# Patient Record
Sex: Female | Born: 1975 | Race: White | Hispanic: No | Marital: Single | Smoking: Never smoker
Health system: Southern US, Community
[De-identification: ages and names within clinical notes are randomized; demographics above are authoritative.]

## PROBLEM LIST (undated history)

## (undated) DIAGNOSIS — R569 Unspecified convulsions: Secondary | ICD-10-CM

## (undated) DIAGNOSIS — T7840XA Allergy, unspecified, initial encounter: Secondary | ICD-10-CM

## (undated) DIAGNOSIS — I10 Essential (primary) hypertension: Secondary | ICD-10-CM

## (undated) DIAGNOSIS — J45909 Unspecified asthma, uncomplicated: Secondary | ICD-10-CM

## (undated) DIAGNOSIS — Z973 Presence of spectacles and contact lenses: Secondary | ICD-10-CM

## (undated) HISTORY — DX: Allergy, unspecified, initial encounter: T78.40XA

## (undated) HISTORY — PX: WISDOM TOOTH EXTRACTION: SHX21

## (undated) HISTORY — DX: Essential (primary) hypertension: I10

## (undated) HISTORY — DX: Unspecified convulsions: R56.9

---

## 2000-12-12 ENCOUNTER — Other Ambulatory Visit: Admission: RE | Admit: 2000-12-12 | Discharge: 2000-12-12 | Payer: Self-pay | Admitting: Family Medicine

## 2001-12-13 ENCOUNTER — Other Ambulatory Visit: Admission: RE | Admit: 2001-12-13 | Discharge: 2001-12-13 | Payer: Self-pay | Admitting: Family Medicine

## 2002-12-26 ENCOUNTER — Other Ambulatory Visit: Admission: RE | Admit: 2002-12-26 | Discharge: 2002-12-26 | Payer: Self-pay | Admitting: Family Medicine

## 2003-12-28 ENCOUNTER — Other Ambulatory Visit: Admission: RE | Admit: 2003-12-28 | Discharge: 2003-12-28 | Payer: Self-pay | Admitting: Family Medicine

## 2003-12-29 ENCOUNTER — Ambulatory Visit: Payer: Self-pay | Admitting: Family Medicine

## 2004-05-09 ENCOUNTER — Ambulatory Visit: Payer: Self-pay | Admitting: Family Medicine

## 2004-08-09 ENCOUNTER — Ambulatory Visit: Payer: Self-pay | Admitting: Family Medicine

## 2004-11-04 ENCOUNTER — Ambulatory Visit: Payer: Self-pay | Admitting: Family Medicine

## 2005-03-10 ENCOUNTER — Other Ambulatory Visit: Admission: RE | Admit: 2005-03-10 | Discharge: 2005-03-10 | Payer: Self-pay | Admitting: Family Medicine

## 2005-03-10 ENCOUNTER — Ambulatory Visit: Payer: Self-pay | Admitting: Family Medicine

## 2005-07-31 ENCOUNTER — Ambulatory Visit: Payer: Self-pay | Admitting: Family Medicine

## 2005-11-16 ENCOUNTER — Ambulatory Visit: Payer: Self-pay | Admitting: Family Medicine

## 2006-03-19 ENCOUNTER — Other Ambulatory Visit: Admission: RE | Admit: 2006-03-19 | Discharge: 2006-03-19 | Payer: Self-pay | Admitting: Internal Medicine

## 2006-03-19 ENCOUNTER — Encounter: Payer: Self-pay | Admitting: Family Medicine

## 2006-03-19 ENCOUNTER — Encounter (INDEPENDENT_AMBULATORY_CARE_PROVIDER_SITE_OTHER): Payer: Self-pay | Admitting: Internal Medicine

## 2006-03-19 ENCOUNTER — Ambulatory Visit: Payer: Self-pay | Admitting: Family Medicine

## 2007-03-04 ENCOUNTER — Ambulatory Visit: Payer: Self-pay | Admitting: Family Medicine

## 2007-03-04 DIAGNOSIS — J069 Acute upper respiratory infection, unspecified: Secondary | ICD-10-CM | POA: Insufficient documentation

## 2007-03-20 ENCOUNTER — Encounter: Payer: Self-pay | Admitting: Family Medicine

## 2007-03-20 DIAGNOSIS — J45909 Unspecified asthma, uncomplicated: Secondary | ICD-10-CM | POA: Insufficient documentation

## 2007-03-20 DIAGNOSIS — I10 Essential (primary) hypertension: Secondary | ICD-10-CM | POA: Insufficient documentation

## 2007-03-20 DIAGNOSIS — J301 Allergic rhinitis due to pollen: Secondary | ICD-10-CM

## 2007-03-25 ENCOUNTER — Other Ambulatory Visit: Admission: RE | Admit: 2007-03-25 | Discharge: 2007-03-25 | Payer: Self-pay | Admitting: Family Medicine

## 2007-03-25 ENCOUNTER — Encounter (INDEPENDENT_AMBULATORY_CARE_PROVIDER_SITE_OTHER): Payer: Self-pay | Admitting: Internal Medicine

## 2007-03-25 ENCOUNTER — Ambulatory Visit: Payer: Self-pay | Admitting: Family Medicine

## 2007-03-25 DIAGNOSIS — G40909 Epilepsy, unspecified, not intractable, without status epilepticus: Secondary | ICD-10-CM

## 2007-03-26 LAB — CONVERTED CEMR LAB
CO2: 26 meq/L (ref 19–32)
GFR calc Af Amer: 126 mL/min
Glucose, Bld: 95 mg/dL (ref 70–99)
Potassium: 4.2 meq/L (ref 3.5–5.1)

## 2007-03-27 ENCOUNTER — Encounter (INDEPENDENT_AMBULATORY_CARE_PROVIDER_SITE_OTHER): Payer: Self-pay | Admitting: Internal Medicine

## 2007-03-28 ENCOUNTER — Encounter (INDEPENDENT_AMBULATORY_CARE_PROVIDER_SITE_OTHER): Payer: Self-pay | Admitting: Internal Medicine

## 2007-03-29 ENCOUNTER — Encounter (INDEPENDENT_AMBULATORY_CARE_PROVIDER_SITE_OTHER): Payer: Self-pay | Admitting: *Deleted

## 2007-06-04 ENCOUNTER — Ambulatory Visit: Payer: Self-pay | Admitting: Family Medicine

## 2007-06-04 DIAGNOSIS — J309 Allergic rhinitis, unspecified: Secondary | ICD-10-CM | POA: Insufficient documentation

## 2007-09-09 ENCOUNTER — Ambulatory Visit: Payer: Self-pay | Admitting: Family Medicine

## 2008-01-22 ENCOUNTER — Encounter (INDEPENDENT_AMBULATORY_CARE_PROVIDER_SITE_OTHER): Payer: Self-pay | Admitting: *Deleted

## 2008-05-14 ENCOUNTER — Other Ambulatory Visit: Admission: RE | Admit: 2008-05-14 | Discharge: 2008-05-14 | Payer: Self-pay | Admitting: Family Medicine

## 2008-05-14 ENCOUNTER — Ambulatory Visit: Payer: Self-pay | Admitting: Family Medicine

## 2008-05-14 ENCOUNTER — Encounter (INDEPENDENT_AMBULATORY_CARE_PROVIDER_SITE_OTHER): Payer: Self-pay | Admitting: Internal Medicine

## 2008-05-14 LAB — CONVERTED CEMR LAB: Pap Smear: NORMAL

## 2008-05-15 LAB — CONVERTED CEMR LAB
BUN: 13 mg/dL (ref 6–23)
Basophils Absolute: 0 10*3/uL (ref 0.0–0.1)
Calcium: 9.1 mg/dL (ref 8.4–10.5)
GFR calc non Af Amer: 103.21 mL/min (ref >60)
Hemoglobin: 14.3 g/dL (ref 12.0–15.0)
LDL Cholesterol: 97 mg/dL (ref 0–99)
Lymphocytes Relative: 18.1 % (ref 12.0–46.0)
Monocytes Relative: 3.4 % (ref 3.0–12.0)
Neutro Abs: 10.6 10*3/uL — ABNORMAL HIGH (ref 1.4–7.7)
Neutrophils Relative %: 75.8 % (ref 43.0–77.0)
Potassium: 4.5 meq/L (ref 3.5–5.1)
RDW: 11.9 % (ref 11.5–14.6)
Sodium: 141 meq/L (ref 135–145)
TSH: 0.81 microintl units/mL (ref 0.35–5.50)
VLDL: 27 mg/dL (ref 0.0–40.0)

## 2008-05-20 ENCOUNTER — Encounter (INDEPENDENT_AMBULATORY_CARE_PROVIDER_SITE_OTHER): Payer: Self-pay | Admitting: *Deleted

## 2008-05-21 ENCOUNTER — Ambulatory Visit: Payer: Self-pay | Admitting: Family Medicine

## 2008-11-04 ENCOUNTER — Ambulatory Visit: Payer: Self-pay | Admitting: Family Medicine

## 2008-11-18 ENCOUNTER — Ambulatory Visit: Payer: Self-pay | Admitting: Family Medicine

## 2009-04-06 ENCOUNTER — Ambulatory Visit: Payer: Self-pay | Admitting: Family Medicine

## 2009-06-03 ENCOUNTER — Ambulatory Visit: Payer: Self-pay | Admitting: Family Medicine

## 2009-06-03 ENCOUNTER — Other Ambulatory Visit: Admission: RE | Admit: 2009-06-03 | Discharge: 2009-06-03 | Payer: Self-pay | Admitting: Family Medicine

## 2009-06-04 LAB — CONVERTED CEMR LAB
ALT: 13 units/L (ref 0–35)
Albumin: 3.7 g/dL (ref 3.5–5.2)
BUN: 9 mg/dL (ref 6–23)
CO2: 24 meq/L (ref 19–32)
Calcium: 8.8 mg/dL (ref 8.4–10.5)
Chloride: 110 meq/L (ref 96–112)
Cholesterol: 166 mg/dL (ref 0–200)
Creatinine, Ser: 0.7 mg/dL (ref 0.4–1.2)
Glucose, Bld: 87 mg/dL (ref 70–99)
HDL: 33.2 mg/dL — ABNORMAL LOW (ref >39.00)
Total CHOL/HDL Ratio: 5
Total Protein: 6.7 g/dL (ref 6.0–8.3)
Triglycerides: 139 mg/dL (ref 0.0–149.0)

## 2009-06-08 ENCOUNTER — Encounter (INDEPENDENT_AMBULATORY_CARE_PROVIDER_SITE_OTHER): Payer: Self-pay | Admitting: *Deleted

## 2009-06-08 LAB — CONVERTED CEMR LAB: Pap Smear: NEGATIVE

## 2009-08-20 ENCOUNTER — Ambulatory Visit: Payer: Self-pay | Admitting: Family Medicine

## 2009-08-20 DIAGNOSIS — J019 Acute sinusitis, unspecified: Secondary | ICD-10-CM

## 2009-09-16 ENCOUNTER — Ambulatory Visit: Payer: Self-pay | Admitting: Family Medicine

## 2009-09-16 DIAGNOSIS — S61209A Unspecified open wound of unspecified finger without damage to nail, initial encounter: Secondary | ICD-10-CM | POA: Insufficient documentation

## 2010-01-17 ENCOUNTER — Encounter: Payer: Self-pay | Admitting: Family Medicine

## 2010-03-29 NOTE — Assessment & Plan Note (Signed)
Summary: ?SINUS INFECTION/CLE   Vital Signs:  Patient profile:   35 year old female Weight:      181 pounds Temp:     97.5 degrees F oral Pulse rate:   80 / minute Pulse rhythm:   regular BP sitting:   132 / 88  (right arm) Cuff size:   regular  Vitals Entered By: Lowella Petties CMA (August 20, 2009 11:42 AM) CC: Sinus headache and pressure   Acute Visit History:      The patient complains of earache, headache, musculoskeletal symptoms, nasal discharge, sinus problems, and sore throat.  These symptoms began 1 week ago.  She denies cough, eye symptoms, fever, genitourinary symptoms, and nausea.        There have been 'cold' or URI symptoms associated with the earache.  There is no history of recent antibiotic usage or recurrent otitis media associated with the earache.        'Cold' or URI symptoms have been present with the sore throat.  There is no history of dysphagia, drooling, or recent exposure to strep.        She complains of sinus pressure, teeth aching, ears being blocked, nasal congestion, purulent drainage, and frontal headache.  The patient has had a past history of sinusitis.        Urine output has been normal.  She is tolerating clear liquids.        Allergies: No Known Drug Allergies  Past History:  Past medical, surgical, family and social histories (including risk factors) reviewed, and no changes noted (except as noted below). Social history (including risk factors) reviewed for relevance to current acute and chronic problems.  Past Medical History: Reviewed history from 03/20/2007 and no changes required. Hypertension  Past Surgical History: Reviewed history from 03/25/2007 and no changes required. wisdom teeth--age 62  Family History: Reviewed history from 03/25/2007 and no changes required. Father: 60's--DM, HBP, elevated lipids Mother: 54--pre DM, obese,  Siblings: 1 br --L&W   DM- type 1--MGM, Mcousin MI-  CVA- 0 Prostate Cancer-0 Breast  Cancer-MGGM Ovarian Cancer--0 Uterine Cancer--0 Colon Cancer-0 Drug/ ETOH Abuse-0 Depression- 0  heart disease--m side Father:  Mother:  Siblings:   Social History: Reviewed history from 05/14/2008 and no changes required. Marital Status:single Children: none Occupation: [employer]  Review of Systems       REVIEW OF SYSTEMS GEN: Acute illness details above. CV: No chest pain or SOB GI: No noted N or V Otherwise, pertinent positives and negatives are noted in the HPI.   Physical Exam  Additional Exam:  Gen: WDWN, NAD; alert,appropriate and cooperative throughout exam  HEENT: Normocephalic and atraumatic. Throat clear, w/o exudate, no LAD, R TM clear, L TM - good landmarks, No fluid present. rhinnorhea.  Left frontal and maxillary sinuses: Tender, max Right frontal and maxillary sinuses: Tender, max  Neck: No ant or post LAD  CV: RRR, No M/G/R  Pulm: Breathing comfortably in no resp distress. no w/c/r  Abd: S,NT,ND,+BS  Extr: no c/c/e  Psych: full affect, pleasant    Impression & Recommendations:  Problem # 1:  SINUSITIS - ACUTE-NOS (ICD-461.9) Assessment New Please see the patient instructions for a detailed list of plans and what was discussed with the patient.   7 days, if signs cont or worsen in next couple days, start ABX  Her updated medication list for this problem includes:    Astepro 0.15 % Soln (Azelastine hcl) .Marland Kitchen... 2 sprays each nostril two times a day  for allergies    Nasonex 50 Mcg/act Susp (Mometasone furoate) .Marland Kitchen... 2 sprays each nostril daily as needed    Amoxicillin 500 Mg Tabs (Amoxicillin) .Marland KitchenMarland KitchenMarland KitchenMarland Kitchen 3 tabs by mouth two times a day for 10 days  Complete Medication List: 1)  Ortho-novum 7/7/7 (28) 0.5/0.75/1-35 Mg-mcg Tabs (Norethin-eth estrad triphasic) .... Take 1 tablet by mouth once a day 2)  Altace 5 Mg Caps (Ramipril) .... Take 1 capsule by mouth once a day 3)  Zyrtec Allergy 10 Mg Tabs (Cetirizine hcl) .... Take 1 tablet by  mouth once a day 4)  Astepro 0.15 % Soln (Azelastine hcl) .... 2 sprays each nostril two times a day for allergies 5)  Proair Hfa 108 (90 Base) Mcg/act Aers (Albuterol sulfate) .... 2 sprays every 4 hrs as needed wheezing 6)  Nasonex 50 Mcg/act Susp (Mometasone furoate) .... 2 sprays each nostril daily as needed 7)  Amoxicillin 500 Mg Tabs (Amoxicillin) .... 3 tabs by mouth two times a day for 10 days  Patient Instructions: 1)  SINUSITIS 2)  Sinuses are cavities in facial skeleton that drain to nose. Impaired drainage and obstruction of sinus passages main cause. 3)  Treatment: 4)  1. Take all Antibiotics 5)  2. Open nasal and sinus canals: Oral decongestant: Sudafed. (CAUTION IF HIGH BLOOD PRESSURE) 6)  3. Steam inhalation 7)  4. Humidifier in room 8)  5. Frequent nasal saline irrigation 9)  6. Moist heat compresses to face 10)  7. Tylenol or Ibuprofen for pain and fever, follow directions on bottle.  Prescriptions: AMOXICILLIN 500 MG TABS (AMOXICILLIN) 3 tabs by mouth two times a day for 10 days  #60 x 0   Entered and Authorized by:   Hannah Beat MD   Signed by:   Hannah Beat MD on 08/20/2009   Method used:   Print then Give to Patient   RxID:   [id number]   Prior Medications (reviewed today): ORTHO-NOVUM 7/7/7 (28) 0.5/0.75/1-35 MG-MCG  TABS (NORETHIN-ETH ESTRAD TRIPHASIC) Take 1 tablet by mouth once a day ALTACE 5 MG  CAPS (RAMIPRIL) Take 1 capsule by mouth once a day ZYRTEC ALLERGY 10 MG  TABS (CETIRIZINE HCL) Take 1 tablet by mouth once a day ASTEPRO 0.15 % SOLN (AZELASTINE HCL) 2 sprays each nostril two times a day for allergies PROAIR HFA 108 (90 BASE) MCG/ACT AERS (ALBUTEROL SULFATE) 2 sprays every 4 hrs as needed wheezing NASONEX 50 MCG/ACT SUSP (MOMETASONE FUROATE) 2 sprays each nostril daily as needed Current Allergies: No known allergies

## 2010-03-29 NOTE — Assessment & Plan Note (Signed)
Summary: SORE THROAT, EAR HURTING   Vital Signs:  Patient profile:   35 year old female Height:      63.5 inches Weight:      174.25 pounds BMI:     30.49 Temp:     98.1 degrees F oral Pulse rate:   84 / minute Pulse rhythm:   regular BP sitting:   132 / 92  (left arm) Cuff size:   regular  Vitals Entered By: Delilah Shan CMA Duncan Dull) (April 06, 2009 10:57 AM) CC: ST, ear hurting, head congestion   History of Present Illness: 35 yo with h/o asthma with 2 weeks of worsening URI symptoms. Started with runny nose, sinus pressure. Now having dry cough and wheezing. No shortness of breath. Subjective fevers and chills. Throat hurts when coughing. 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  Current Medications (verified): 1)  Ortho-Novum 7/7/7 (28) 0.5/0.75/1-35 Mg-Mcg  Tabs (Norethin-Eth Estrad Triphasic) .... Take 1 Tablet By Mouth Once A Day 2)  Altace 5 Mg  Caps (Ramipril) .... Take 1 Capsule By Mouth Once A Day 3)  Zyrtec Allergy 10 Mg  Tabs (Cetirizine Hcl) .... Take 1 Tablet By Mouth Once A Day 4)  Astepro 0.15 % Soln (Azelastine Hcl) .... 2 Sprays Each Nostril Two Times A Day For Allergies 5)  Proair Hfa 108 (90 Base) Mcg/act Aers (Albuterol Sulfate) .... 2 Sprays Every 4 Hrs As Needed Wheezing 6)  Nasonex 50 Mcg/act Susp (Mometasone Furoate) .... 2 Sprays Each Nostril Daily As Needed 7)  Augmentin 500-125 Mg Tabs (Amoxicillin-Pot Clavulanate) .Marland Kitchen.. 1 By Mouth 2 Times Daily X 10 Days  Allergies (verified): No Known Drug Allergies  Review of Systems      See HPI General:  Complains of chills and fever. ENT:  Complains of earache, postnasal drainage, sinus pressure, and sore throat; denies ear discharge. Resp:  Complains of cough; denies shortness of breath, sputum productive, and wheezing.  Physical Exam  General:  alert, well-developed, well-nourished, and well-hydrated.   VS reviewed - afebrile, normotensive Ears:  TMs retracted with fluid  bilat Nose:  boggy and edematus, sinuses neg, no airflow obstruction.   Mouth:  no exudates and pharyngeal erythema.   Lungs:  normal respiratory effort, no intercostal retractions, no accessory muscle use,, scattered exp wheezes, no crackles. Heart:  normal rate, regular rhythm, and no murmur.   Psych:  normally interactive and good eye contact.     Impression & Recommendations:  Problem # 1:  URI (ICD-465.9) Assessment New with probable sinusitis/bronchitis. Given duration and progression of symptoms, will treat with Augmentin. Continue as needed albuterol and other supportive measures. See pt instructions for details. Her updated medication list for this problem includes:    Zyrtec Allergy 10 Mg Tabs (Cetirizine hcl) .Marland Kitchen... Take 1 tablet by mouth once a day  Complete Medication List: 1)  Ortho-novum 7/7/7 (28) 0.5/0.75/1-35 Mg-mcg Tabs (Norethin-eth estrad triphasic) .... Take 1 tablet by mouth once a day 2)  Altace 5 Mg Caps (Ramipril) .... Take 1 capsule by mouth once a day 3)  Zyrtec Allergy 10 Mg Tabs (Cetirizine hcl) .... Take 1 tablet by mouth once a day 4)  Astepro 0.15 % Soln (Azelastine hcl) .... 2 sprays each nostril two times a day for allergies 5)  Proair Hfa 108 (90 Base) Mcg/act Aers (Albuterol sulfate) .... 2 sprays every 4 hrs as needed wheezing 6)  Nasonex 50 Mcg/act Susp (Mometasone furoate) .... 2 sprays each nostril daily as needed 7)  Augmentin 500-125 Mg Tabs (Amoxicillin-pot clavulanate) .Marland Kitchen.. 1 by mouth 2 times daily x 10 days  Patient Instructions: 1)  Take antibiotic as directed.  Drink lots of fluids.  Treat sympotmatically with Mucinex, nasal saline irrigation, and Tylenol/Ibuprofen. Use albuterol inhaler as needed. Call if not improving as expected in 5-7 days.  Prescriptions: AUGMENTIN 500-125 MG TABS (AMOXICILLIN-POT CLAVULANATE) 1 by mouth 2 times daily x 10 days  #20 x 0   Entered and Authorized by:   Ruthe Mannan MD   Signed by:   Ruthe Mannan MD on  04/06/2009   Method used:   Electronically to        [facility]* (retail)       [address]       [place], [place]  [address]       Ph: [phone]       Fax: [phone]   RxID:   [id number] AUGMENTIN 500-125 MG TABS (AMOXICILLIN-POT CLAVULANATE) 1 by mouth 2 times daily x 10 days  #20 x 0   Entered and Authorized by:   Ruthe Mannan MD   Signed by:   Ruthe Mannan MD on 04/06/2009   Method used:   Electronically to        [facility]. [phone]* (retail)       [address]       [place], [place]  [address]       Ph: [phone]       Fax: [phone]   RxID:   [id number]   Current Allergies (reviewed today): No known allergies

## 2010-03-29 NOTE — Assessment & Plan Note (Signed)
Summary: CUT FINGER   Vital Signs:  Patient profile:   35 year old female Height:      63.5 inches Weight:      184.4 pounds BMI:     32.27 Temp:     98.9 degrees F oral Pulse rate:   80 / minute Pulse rhythm:   regular BP sitting:   132 / 82  (left arm) Cuff size:   regular  Vitals Entered By: Benny Lennert CMA Duncan Dull) (September 16, 2009 2:48 PM)  History of Present Illness: 35 yo cut 5th finger on left hand this morning on a metal cabinet.  Has been bleeding since she cut it.  Does stop when she applies pressure but keeps trickling blood. Not dizzy or lightheaded. No known problems with clotting.  Tetanus immunization UTD.  Current Medications (verified): 1)  Ortho-Novum 7/7/7 (28) 0.5/0.75/1-35 Mg-Mcg  Tabs (Norethin-Eth Estrad Triphasic) .... Take 1 Tablet By Mouth Once A Day 2)  Altace 5 Mg  Caps (Ramipril) .... Take 1 Capsule By Mouth Once A Day 3)  Zyrtec Allergy 10 Mg  Tabs (Cetirizine Hcl) .... Take 1 Tablet By Mouth Once A Day 4)  Astepro 0.15 % Soln (Azelastine Hcl) .... 2 Sprays Each Nostril Two Times A Day For Allergies 5)  Proair Hfa 108 (90 Base) Mcg/act Aers (Albuterol Sulfate) .... 2 Sprays Every 4 Hrs As Needed Wheezing 6)  Nasonex 50 Mcg/act Susp (Mometasone Furoate) .... 2 Sprays Each Nostril Daily As Needed  Allergies (verified): No Known Drug Allergies  Review of Systems      See HPI General:  Denies chills and fever. Neuro:  Denies weakness.  Physical Exam  General:  alert, well-developed, well-nourished, and well-hydrated.    Skin:  1cm cut on finger, bleeding Psych:  Cognition and judgment appear intact. Alert and cooperative with normal attention span and concentration. No apparent delusions, illusions, hallucinations   Impression & Recommendations:  Problem # 1:  LACERATION OF FINGER (ICD-883.0) Assessment New top layer of skin removed, unable to stitch at this time and likely not necessary. Finger cleaned with water and alcohol.   Cauterized with Silver nitrite. Bleeding has stopped.  Hemodynamically stable.  Complete Medication List: 1)  Ortho-novum 7/7/7 (28) 0.5/0.75/1-35 Mg-mcg Tabs (Norethin-eth estrad triphasic) .... Take 1 tablet by mouth once a day 2)  Altace 5 Mg Caps (Ramipril) .... Take 1 capsule by mouth once a day 3)  Zyrtec Allergy 10 Mg Tabs (Cetirizine hcl) .... Take 1 tablet by mouth once a day 4)  Astepro 0.15 % Soln (Azelastine hcl) .... 2 sprays each nostril two times a day for allergies 5)  Proair Hfa 108 (90 Base) Mcg/act Aers (Albuterol sulfate) .... 2 sprays every 4 hrs as needed wheezing 6)  Nasonex 50 Mcg/act Susp (Mometasone furoate) .... 2 sprays each nostril daily as needed   Current Allergies (reviewed today): No known allergies

## 2010-03-29 NOTE — Assessment & Plan Note (Signed)
Summary: PAP SMEAR AND CPX/BILLIE'S PT/CLE   Vital Signs:  Patient profile:   35 year old female LMP:     05/20/2009 Height:      63.5 inches Weight:      174.13 pounds BMI:     30.47 Temp:     97.7 degrees F oral Pulse rate:   84 / minute Pulse rhythm:   regular BP sitting:   142 / 88  (left arm) Cuff size:   regular  Vitals Entered By: Delilah Shan CMA Duncan Dull) (June 03, 2009 8:39 AM)  Serial Vital Signs/Assessments:  Time      Position  BP       Pulse  Resp  Temp     By                     161/09                         Ruthe Mannan MD  CC: Pap / CPX LMP (date): 05/20/2009     Enter LMP: 05/20/2009 Last PAP Result normal   History of Present Illness: 35 yo with h/o asthma here for CPX.  Asthma- uses her proair once or twice a week.  Always a little worse this time of year with seasonal allergies. Also uses Astepro and nasonex as needed for Allergic rhinitis.  HTN- Has been on Altace 5 mg daily for over two years. BP elevated today but nervous about pap.  Also has not taken her medication yet today. No side effects.  Well woman- GO, no h/o abnormal pap smears. No family h/o cevical, breast or uterine cancer. Has been on ortho novum for years, no issues with it. Has not had lipid panel checked since last year.  Current Medications (verified): 1)  Ortho-Novum 7/7/7 (28) 0.5/0.75/1-35 Mg-Mcg  Tabs (Norethin-Eth Estrad Triphasic) .... Take 1 Tablet By Mouth Once A Day 2)  Altace 5 Mg  Caps (Ramipril) .... Take 1 Capsule By Mouth Once A Day 3)  Zyrtec Allergy 10 Mg  Tabs (Cetirizine Hcl) .... Take 1 Tablet By Mouth Once A Day 4)  Astepro 0.15 % Soln (Azelastine Hcl) .... 2 Sprays Each Nostril Two Times A Day For Allergies 5)  Proair Hfa 108 (90 Base) Mcg/act Aers (Albuterol Sulfate) .... 2 Sprays Every 4 Hrs As Needed Wheezing 6)  Nasonex 50 Mcg/act Susp (Mometasone Furoate) .... 2 Sprays Each Nostril Daily As Needed  Allergies (verified): No Known Drug  Allergies  Past History:  Past Medical History: Last updated: 03/20/2007 Hypertension  Past Surgical History: Last updated: 03/25/2007 wisdom teeth--age 69  Family History: Last updated: 03/25/2007 Father: 60's--DM, HBP, elevated lipids Mother: 54--pre DM, obese,  Siblings: 1 br --L&W   DM- type 1--MGM, Mcousin MI-  CVA- 0 Prostate Cancer-0 Breast Cancer-MGGM Ovarian Cancer--0 Uterine Cancer--0 Colon Cancer-0 Drug/ ETOH Abuse-0 Depression- 0  heart disease--m side Father:  Mother:  Siblings:   Social History: Last updated: 05/14/2008 Marital Status:single Children: none Occupation: [employer]  Risk Factors: Alcohol Use: <1 (05/14/2008) Caffeine Use: 3 (05/14/2008) Exercise: yes (05/14/2008)  Risk Factors: Smoking Status: never (05/14/2008) Passive Smoke Exposure: yes (03/25/2007)  Review of Systems General:  Denies fatigue, fever, weakness, and weight loss. Eyes:  Denies blurring. ENT:  Denies ear discharge. CV:  Denies chest pain or discomfort and shortness of breath with exertion. Resp:  Denies cough, shortness of breath, sputum productive, and wheezing. GI:  Denies abdominal pain  and bloody stools. GU:  Denies discharge and dysuria. MS:  Denies joint pain, joint redness, and joint swelling. Derm:  Denies rash. Neuro:  Denies headaches. Psych:  Denies anxiety and depression. Endo:  Denies cold intolerance and heat intolerance.  Physical Exam  General:  alert, well-developed, well-nourished, and well-hydrated.    Eyes:  vision grossly intact, pupils equal, and pupils round.   Ears:  R ear normal and L ear normal.   Mouth:  good dentition.   Neck:  No deformities, masses, or tenderness noted. Breasts:  No mass, nodules, thickening, tenderness, bulging, retraction, inflamation, nipple discharge or skin changes noted.   Lungs:  Normal respiratory effort, chest expands symmetrically. Lungs are clear to auscultation, no crackles or  wheezes. Heart:  Normal rate and regular rhythm. S1 and S2 normal without gallop, murmur, click, rub or other extra sounds. Abdomen:  Bowel sounds positive,abdomen soft and non-tender without masses, organomegaly or hernias noted. Genitalia:  Pelvic Exam:        External: normal female genitalia without lesions or masses        Vagina: normal without lesions or masses        Cervix: normal without lesions or masses        Adnexa: normal bimanual exam without masses or fullness        Uterus: normal by palpation        Pap smear: performed Extremities:  No clubbing, cyanosis, edema, or deformity noted with normal full range of motion of all joints.   Psych:  Cognition and judgment appear intact. Alert and cooperative with normal attention span and concentration. No apparent delusions, illusions, hallucinations   Impression & Recommendations:  Problem # 1:  WELL ADULT (ICD-V70.0) Reviewed preventive care protocols, scheduled due services, and updated immunizations Discussed nutrition, exercise, diet, and healthy lifestyle.  Pap, lipid panel, BMET, hepatic panel today.  Problem # 2:  HYPERTENSION (ICD-401.9) Assessment: Unchanged Stable, continue Altace. Her updated medication list for this problem includes:    Altace 5 Mg Caps (Ramipril) .Marland Kitchen... Take 1 capsule by mouth once a day  Orders: Venipuncture (46962) TLB-BMP (Basic Metabolic Panel-BMET) (80048-METABOL)  Problem # 3:  ASTHMA, MILD (ICD-493.90) Assessment: Unchanged Appears well controlled with as needed rescue inhaler. Her updated medication list for this problem includes:    Proair Hfa 108 (90 Base) Mcg/act Aers (Albuterol sulfate) .Marland Kitchen... 2 sprays every 4 hrs as needed wheezing  Complete Medication List: 1)  Ortho-novum 7/7/7 (28) 0.5/0.75/1-35 Mg-mcg Tabs (Norethin-eth estrad triphasic) .... Take 1 tablet by mouth once a day 2)  Altace 5 Mg Caps (Ramipril) .... Take 1 capsule by mouth once a day 3)  Zyrtec Allergy 10 Mg  Tabs (Cetirizine hcl) .... Take 1 tablet by mouth once a day 4)  Astepro 0.15 % Soln (Azelastine hcl) .... 2 sprays each nostril two times a day for allergies 5)  Proair Hfa 108 (90 Base) Mcg/act Aers (Albuterol sulfate) .... 2 sprays every 4 hrs as needed wheezing 6)  Nasonex 50 Mcg/act Susp (Mometasone furoate) .... 2 sprays each nostril daily as needed  Other Orders: TLB-Lipid Panel (80061-LIPID) TLB-Hepatic/Liver Function Pnl (80076-HEPATIC) Pap Smear, Thin Prep ( Collection of) (X5284)  Patient Instructions: 1)  Great to see you, Jamelyn. 2)  We will let you know about your lab work tomorrow or Monday. 3)  Have a great weekend. Prescriptions: NASONEX 50 MCG/ACT SUSP (MOMETASONE FUROATE) 2 sprays each nostril daily as needed  #1 x 1   Entered and Authorized  by:   Ruthe Mannan MD   Signed by:   Ruthe Mannan MD on 06/03/2009   Method used:   Electronically to        [facility]. [phone]* (retail)       [address]       [place], [place]  [address]       Ph: [phone]       Fax: [phone]   RxID:   [id number] PROAIR HFA 108 (90 BASE) MCG/ACT AERS (ALBUTEROL SULFATE) 2 sprays every 4 hrs as needed wheezing  #1 x 1   Entered and Authorized by:   Ruthe Mannan MD   Signed by:   Ruthe Mannan MD on 06/03/2009   Method used:   Electronically to        [facility]. [phone]* (retail)       [address]       [place], [place]  [address]       Ph: [phone]       Fax: [phone]   RxID:   [id number] ASTEPRO 0.15 % SOLN (AZELASTINE HCL) 2 sprays each nostril two times a day for allergies Brand medically necessary #1 x 12   Entered and Authorized by:   Ruthe Mannan MD   Signed by:   Ruthe Mannan MD on 06/03/2009   Method used:   Electronically to        [facility]. [phone]* (retail)       [address]       [place], [place]  [address]       Ph: [phone]        Fax: [phone]   RxID:   [id number] ORTHO-NOVUM 7/7/7 (28) 0.5/0.75/1-35 MG-MCG  TABS (NORETHIN-ETH ESTRAD TRIPHASIC) Take 1 tablet by mouth once a day  #28 Tablet x 12   Entered and Authorized by:   Ruthe Mannan MD   Signed by:   Ruthe Mannan MD on 06/03/2009   Method used:   Electronically to        [facility]. [phone]* (retail)       [address]       [place], [place]  [address]       Ph: [phone]       Fax: [phone]   RxID:   [id number] ALTACE 5 MG  CAPS (RAMIPRIL) Take 1 capsule by mouth once a day  #30 Capsule x 6   Entered and Authorized by:   Ruthe Mannan MD   Signed by:   Ruthe Mannan MD on 06/03/2009   Method used:   Electronically to        [facility]. [phone]* (retail)       [address]       [place], [place]  [address]       Ph: [phone]       Fax: [phone]   RxID:   [id number]   Current Allergies (reviewed today): No known allergies

## 2010-03-29 NOTE — Miscellaneous (Signed)
Summary: Flu vaccine   Clinical Lists Changes  Observations: Added new observation of FLU VAX: Historical (01/11/2010 15:52)      Influenza Immunization History:    Influenza # 1:  Historical (01/11/2010) Received form from [facility], [address] [place], [place]

## 2010-03-29 NOTE — Letter (Signed)
Summary: Results Follow up Letter  [place] [facility]  [address] [place], [place] [address]   Phone: [phone]  Fax: [phone]    06/08/2009 MRN: [id number]    [facility] [address] [place], [place]  [address]    Dear Ms. Ehmke,  The following are the results of your recent test(s):  Test         Result    Pap Smear:        Normal ___X__  Not Normal _____ Comments: ______________________________________________________ Cholesterol: LDL(Bad cholesterol):         Your goal is less than:         HDL (Good cholesterol):       Your goal is more than: Comments:  ______________________________________________________ Mammogram:        Normal _____  Not Normal _____ Comments: _____________________________________________________________________ Hemoccult:        Normal _____  Not normal _______ Comments:    _____________________________________________________________________ Other Tests:    We routinely do not discuss normal results over the telephone.  If you desire a copy of the results, or you have any questions about this information we can discuss them at your next office visit.   Sincerely,    Ruthe Mannan,  M.D.  TA:lsf

## 2010-06-06 ENCOUNTER — Other Ambulatory Visit: Payer: Self-pay | Admitting: Family Medicine

## 2010-06-08 ENCOUNTER — Other Ambulatory Visit: Payer: Self-pay | Admitting: *Deleted

## 2010-06-08 MED ORDER — RAMIPRIL 5 MG PO CAPS: 5.0000 mg | ORAL_CAPSULE | Freq: Every day | ORAL | Status: DC

## 2010-06-10 ENCOUNTER — Telehealth: Payer: Self-pay | Admitting: *Deleted

## 2010-06-10 NOTE — Telephone Encounter (Signed)
Pt called complaining of diarrhea x 24 hours, asked to be seen.  No other symptoms- no fever, abd pain or vomiting.   Advised nothing that we can really do for her here.  Advised her to drink plenty of fluids, rest, avoid dairy products, try BRAT diet.  Call back the first of the week if still having problem.  Suggested she not take anything to try and stop it, best to get it out of her system.

## 2010-06-10 NOTE — Telephone Encounter (Signed)
Agreed -

## 2010-06-10 NOTE — Telephone Encounter (Signed)
Patient advised as instructed via telephone. 

## 2010-06-20 ENCOUNTER — Encounter: Payer: Self-pay | Admitting: Family Medicine

## 2010-06-24 ENCOUNTER — Encounter: Payer: Self-pay | Admitting: Family Medicine

## 2010-06-24 LAB — HM PAP SMEAR

## 2010-06-27 ENCOUNTER — Encounter: Payer: Self-pay | Admitting: Family Medicine

## 2010-07-05 ENCOUNTER — Other Ambulatory Visit: Payer: Self-pay | Admitting: Family Medicine

## 2010-07-30 ENCOUNTER — Other Ambulatory Visit: Payer: Self-pay | Admitting: Family Medicine

## 2010-08-02 ENCOUNTER — Other Ambulatory Visit: Payer: Self-pay | Admitting: *Deleted

## 2010-08-02 MED ORDER — NORETHIN-ETH ESTRAD TRIPHASIC 0.5/0.75/1-35 MG-MCG PO TABS: 1.0000 | ORAL_TABLET | Freq: Every day | ORAL | Status: DC

## 2010-08-02 NOTE — Telephone Encounter (Signed)
Pharmacy did not receive rx request

## 2010-08-09 ENCOUNTER — Other Ambulatory Visit (HOSPITAL_COMMUNITY)
Admission: RE | Admit: 2010-08-09 | Discharge: 2010-08-09 | Disposition: A | Discharge status: Home / Self Care | Payer: BC Managed Care – PPO | Source: Ambulatory Visit | Attending: Family Medicine | Admitting: Family Medicine

## 2010-08-09 ENCOUNTER — Encounter: Payer: Self-pay | Admitting: Family Medicine

## 2010-08-09 ENCOUNTER — Ambulatory Visit (INDEPENDENT_AMBULATORY_CARE_PROVIDER_SITE_OTHER): Payer: BC Managed Care – PPO | Admitting: Family Medicine

## 2010-08-09 VITALS — BP 130/90 | HR 79 | Temp 98.0°F | Ht 63.5 in | Wt 177.5 lb

## 2010-08-09 DIAGNOSIS — Z136 Encounter for screening for cardiovascular disorders: Secondary | ICD-10-CM

## 2010-08-09 DIAGNOSIS — Z01419 Encounter for gynecological examination (general) (routine) without abnormal findings: Secondary | ICD-10-CM | POA: Insufficient documentation

## 2010-08-09 DIAGNOSIS — Z1159 Encounter for screening for other viral diseases: Secondary | ICD-10-CM | POA: Insufficient documentation

## 2010-08-09 DIAGNOSIS — I10 Essential (primary) hypertension: Secondary | ICD-10-CM

## 2010-08-09 DIAGNOSIS — Z0000 Encounter for general adult medical examination without abnormal findings: Secondary | ICD-10-CM | POA: Insufficient documentation

## 2010-08-09 LAB — BASIC METABOLIC PANEL
CO2: 25 mEq/L (ref 19–32)
Chloride: 107 mEq/L (ref 96–112)
GFR: 89.85 mL/min (ref >60.00)
Glucose, Bld: 88 mg/dL (ref 70–99)
Potassium: 4 mEq/L (ref 3.5–5.1)
Sodium: 136 mEq/L (ref 135–145)

## 2010-08-09 LAB — LIPID PANEL
HDL: 30.9 mg/dL — ABNORMAL LOW (ref >39.00)
LDL Cholesterol: 101 mg/dL — ABNORMAL HIGH (ref 0–99)
VLDL: 32.8 mg/dL (ref 0.0–40.0)

## 2010-08-09 NOTE — Progress Notes (Signed)
35 yo with h/o asthma here for CPX.  Asthma- uses her proair once or twice a month.  Always a little worse this time of year with seasonal allergies. Also uses Astepro and nasonex as needed for Allergic rhinitis.  HTN- Has been on Altace 5 mg daily for over two years. No side effects.  Dad was just diagnosed with ALS, but not rapidly progressive type so she is doing ok.    Well woman- GO, no h/o abnormal pap smears. No family h/o cevical, breast or uterine cancer. Has been on ortho novum for years, no issues with it. Denies any dysuria, vaginal discharge or other symptoms.  The PMH, PSH, Social History, Family History, Medications, and allergies have been reviewed in [facility], and have been updated if relevant.   Review of Systems  See HPI Patient reports no  vision/ hearing changes,anorexia, weight change, fever ,adenopathy, persistant / recurrent hoarseness, swallowing issues, chest pain, edema,persistant / recurrent cough, hemoptysis, dyspnea(rest, exertional, paroxysmal nocturnal), gastrointestinal  bleeding (melena, rectal bleeding), abdominal pain, excessive heart burn, GU symptoms(dysuria, hematuria, pyuria, voiding/incontinence  Issues) syncope, focal weakness, severe memory loss, concerning skin lesions, depression, anxiety, abnormal bruising/bleeding, major joint swelling, breast masses or abnormal vaginal bleeding.     Physical Exam BP 130/90  Pulse 79  Temp(Src) 98 F (36.7 C) (Oral)  Ht 5' 3.5" (1.613 m)  Wt 177 lb 8 oz (80.513 kg)  BMI 30.95 kg/m2  LMP 07/08/2010  General:  alert, well-developed, well-nourished, and well-hydrated.   Eyes:  vision grossly intact, pupils equal, and pupils round.   Ears:  R ear normal and L ear normal.   Mouth:  good dentition.   Neck:  No deformities, masses, or tenderness noted. Breasts:  No mass, nodules, thickening, tenderness, bulging, retraction, inflamation, nipple discharge or skin changes noted.   Lungs:  Normal respiratory  effort, chest expands symmetrically. Lungs are clear to auscultation, no crackles or wheezes. Heart:  Normal rate and regular rhythm. S1 and S2 normal without gallop, murmur, click, rub or other extra sounds. Abdomen:  Bowel sounds positive,abdomen soft and non-tender without masses, organomegaly or hernias noted. Genitalia:  Pelvic Exam:        External: normal female genitalia without lesions or masses        Vagina: normal without lesions or masses        Cervix: normal without lesions or masses        Adnexa: normal bimanual exam without masses or fullness        Uterus: normal by palpation        Pap smear: performed Extremities:  No clubbing, cyanosis, edema, or deformity noted with normal full range of motion of all joints.   Psych:  Cognition and judgment appear intact. Alert and cooperative with normal attention span and concentration. No apparent delusions, illusions, hallucinations  1. HYPERTENSION   Stable, continue Altace 5 mg daily.   BMET today.  Basic Metabolic Panel (BMET)  2. Routine general medical examination at a health care facility   Reviewed preventive care protocols, scheduled due services, and updated immunizations Discussed nutrition, exercise, diet, and healthy lifestyle.

## 2010-08-12 ENCOUNTER — Encounter: Payer: Self-pay | Admitting: *Deleted

## 2010-11-18 ENCOUNTER — Ambulatory Visit (INDEPENDENT_AMBULATORY_CARE_PROVIDER_SITE_OTHER): Payer: BC Managed Care – PPO | Admitting: Family Medicine

## 2010-11-18 ENCOUNTER — Encounter: Payer: Self-pay | Admitting: Family Medicine

## 2010-11-18 VITALS — BP 130/84 | HR 99 | Temp 98.8°F | Wt 182.0 lb

## 2010-11-18 DIAGNOSIS — J019 Acute sinusitis, unspecified: Secondary | ICD-10-CM

## 2010-11-18 MED ORDER — AMOXICILLIN-POT CLAVULANATE 875-125 MG PO TABS: 1.0000 | ORAL_TABLET | Freq: Two times a day (BID) | ORAL | Status: AC

## 2010-11-18 NOTE — Progress Notes (Signed)
SUBJECTIVE:  Shari Armstrong is a 35 y.o. female who complains of coryza, congestion, sneezing, post nasal drip, myalgias, fever and sjnus pressure for 7  days. She denies a history of chest pain, chills, dizziness and fatigue and has a history of asthma. Patient denies smoke cigarettes.   OBJECTIVE: BP 130/84  Pulse 99  Temp(Src) 98.8 F (37.1 C) (Oral)  Wt 182 lb (82.555 kg)  LMP 10/30/2010  She appears well, vital signs are as noted. Ears normal.  Throat and pharynx normal.  Neck supple. No adenopathy in the neck. Nose is congested. Sinuses non tender. The chest is clear, without wheezes or rales.  ASSESSMENT:  sinusitis  PLAN: Given duration and progression of symptoms, will treat for bacterial sinusitis. Symptomatic therapy suggested: push fluids, rest and return office visit prn if symptoms persist or worsen.Call or return to clinic prn if these symptoms worsen or fail to improve as anticipated.

## 2010-11-18 NOTE — Patient Instructions (Signed)
Take antibiotic as directed.  Drink lots of fluids.  Treat sympotmatically with Mucinex, nasal saline irrigation, and Tylenol/Ibuprofen. Also try claritin D or zyrtec D over the counter- two times a day as needed ( have to sign for them at pharmacy). You can use warm compresses.  Cough suppressant at night. Call if not improving as expected in 5-7 days.    

## 2011-01-20 ENCOUNTER — Other Ambulatory Visit: Payer: Self-pay | Admitting: Family Medicine

## 2011-01-24 NOTE — Telephone Encounter (Signed)
Medication phoned to [facility] as instructed.

## 2011-02-23 ENCOUNTER — Other Ambulatory Visit: Payer: Self-pay | Admitting: Family Medicine

## 2011-04-26 ENCOUNTER — Encounter: Payer: Self-pay | Admitting: Family Medicine

## 2011-04-26 ENCOUNTER — Ambulatory Visit (INDEPENDENT_AMBULATORY_CARE_PROVIDER_SITE_OTHER): Payer: BC Managed Care – PPO | Admitting: Family Medicine

## 2011-04-26 VITALS — BP 122/90 | HR 76 | Temp 97.7°F | Wt 182.0 lb

## 2011-04-26 DIAGNOSIS — J019 Acute sinusitis, unspecified: Secondary | ICD-10-CM

## 2011-04-26 MED ORDER — AMOXICILLIN-POT CLAVULANATE 875-125 MG PO TABS: 1.0000 | ORAL_TABLET | Freq: Two times a day (BID) | ORAL | Status: DC

## 2011-04-26 MED ORDER — AMOXICILLIN-POT CLAVULANATE 875-125 MG PO TABS: 1.0000 | ORAL_TABLET | Freq: Two times a day (BID) | ORAL | Status: AC

## 2011-04-26 NOTE — Progress Notes (Signed)
SUBJECTIVE:  Shari Armstrong is a 36 y.o. female who complains of coryza, congestion, sneezing, sore throat and bilateral sinus pain for 8 days. She denies a history of anorexia and chest pain and has a history of asthma. Patient denies smoke cigarettes.   Patient Active Problem List  Diagnoses  . HYPERTENSION  . SINUSITIS - ACUTE-NOS  . URI  . ALLERGIC RHINITIS, SEASONAL  . ALLERGIC RHINITIS  . ASTHMA, MILD  . LACERATION OF FINGER  . SEIZURE DISORDER, HX OF  . Routine general medical examination at a health care facility   Past Medical History  Diagnosis Date  . Hypertension    Past Surgical History  Procedure Date  . Wisdom tooth extraction     age 52   History  Substance Use Topics  . Smoking status: Never Smoker   . Smokeless tobacco: Not on file  . Alcohol Use: Not on file   Family History  Problem Relation Age of Onset  . Diabetes Mother     pre diabetic  . Obesity Mother   . Diabetes Father   . Hypertension Father   . Hyperlipidemia Father   . Diabetes Maternal Grandmother   . Diabetes Cousin   . Cancer Other     breast   No Known Allergies Current Outpatient Prescriptions on File Prior to Visit  Medication Sig Dispense Refill  . albuterol (PROAIR HFA) 108 (90 BASE) MCG/ACT inhaler Inhale 2 puffs into the lungs every 4 (four) hours as needed.        Bryson Ha 7/7/7 0.5/0.75/1-35 MG-MCG tablet take 1 tablet by mouth once daily  28 tablet  12  . Azelastine HCl (ASTEPRO) 0.15 % SOLN 2 sprays by Nasal route 2 (two) times daily.        . fexofenadine (ALLEGRA) 180 MG tablet Take 180 mg by mouth daily.        . mometasone (NASONEX) 50 MCG/ACT nasal spray 2 sprays by Nasal route daily.        . ramipril (ALTACE) 5 MG capsule take 1 capsule by mouth once daily  30 capsule  6   The PMH, PSH, Social History, Family History, Medications, and allergies have been reviewed in [facility], and have been updated if relevant.  OBJECTIVE: BP 122/90  Pulse 76  Temp(Src) 97.7 F  (36.5 C) (Oral)  Wt 182 lb (82.555 kg)  She appears well, vital signs are as noted. Ears normal.  Throat and pharynx normal.  Neck supple. No adenopathy in the neck. Nose is congested. Sinuses tender throughout. The chest is clear, without wheezes or rales.  ASSESSMENT:  sinusitis  PLAN: Given duration and progression of symptoms, will treat for bacterial sinusitis. Symptomatic therapy suggested: push fluids, rest and return office visit prn if symptoms persist or worsen.  Call or return to clinic prn if these symptoms worsen or fail to improve as anticipated.

## 2011-04-26 NOTE — Patient Instructions (Signed)
Take antibiotic as directed.  Drink lots of fluids.  Treat sympotmatically with Mucinex, nasal saline irrigation, and Tylenol/Ibuprofen. Also try claritin D or zyrtec D over the counter- two times a day as needed ( have to sign for them at pharmacy). You can use warm compresses.  Cough suppressant at night. Call if not improving as expected in 5-7 days.    

## 2011-07-18 ENCOUNTER — Other Ambulatory Visit: Payer: Self-pay | Admitting: Family Medicine

## 2011-08-12 ENCOUNTER — Other Ambulatory Visit: Payer: Self-pay | Admitting: Family Medicine

## 2011-10-09 ENCOUNTER — Other Ambulatory Visit: Payer: Self-pay | Admitting: Family Medicine

## 2011-11-07 ENCOUNTER — Other Ambulatory Visit: Payer: Self-pay | Admitting: Family Medicine

## 2011-12-11 ENCOUNTER — Other Ambulatory Visit: Payer: Self-pay | Admitting: Family Medicine

## 2012-01-14 ENCOUNTER — Other Ambulatory Visit: Payer: Self-pay | Admitting: Family Medicine

## 2012-02-22 ENCOUNTER — Other Ambulatory Visit: Payer: Self-pay | Admitting: Family Medicine

## 2012-02-22 NOTE — Telephone Encounter (Signed)
Ok to refill one month. Needs to be seen for further refills. 

## 2012-02-22 NOTE — Telephone Encounter (Signed)
Patient was due for CPE back in June of 2013.  Please advise.

## 2012-02-22 NOTE — Telephone Encounter (Signed)
LMOVM to schedule CPE.

## 2012-02-28 ENCOUNTER — Other Ambulatory Visit: Payer: Self-pay | Admitting: Family Medicine

## 2012-03-03 ENCOUNTER — Other Ambulatory Visit: Payer: Self-pay | Admitting: Family Medicine

## 2012-04-16 ENCOUNTER — Other Ambulatory Visit (HOSPITAL_COMMUNITY)
Admission: RE | Admit: 2012-04-16 | Discharge: 2012-04-16 | Disposition: A | Discharge status: Home / Self Care | Payer: BC Managed Care – PPO | Source: Ambulatory Visit | Attending: Family Medicine | Admitting: Family Medicine

## 2012-04-16 ENCOUNTER — Encounter: Payer: Self-pay | Admitting: Family Medicine

## 2012-04-16 ENCOUNTER — Ambulatory Visit (INDEPENDENT_AMBULATORY_CARE_PROVIDER_SITE_OTHER): Payer: BC Managed Care – PPO | Admitting: Family Medicine

## 2012-04-16 VITALS — BP 126/82 | HR 89 | Temp 97.8°F | Ht 63.5 in | Wt 194.2 lb

## 2012-04-16 DIAGNOSIS — I10 Essential (primary) hypertension: Secondary | ICD-10-CM

## 2012-04-16 DIAGNOSIS — Z136 Encounter for screening for cardiovascular disorders: Secondary | ICD-10-CM

## 2012-04-16 DIAGNOSIS — Z0000 Encounter for general adult medical examination without abnormal findings: Secondary | ICD-10-CM

## 2012-04-16 DIAGNOSIS — Z01419 Encounter for gynecological examination (general) (routine) without abnormal findings: Secondary | ICD-10-CM | POA: Insufficient documentation

## 2012-04-16 DIAGNOSIS — Z8669 Personal history of other diseases of the nervous system and sense organs: Secondary | ICD-10-CM

## 2012-04-16 LAB — COMPREHENSIVE METABOLIC PANEL
Albumin: 3.9 g/dL (ref 3.5–5.2)
BUN: 12 mg/dL (ref 6–23)
CO2: 24 mEq/L (ref 19–32)
Calcium: 8.9 mg/dL (ref 8.4–10.5)
GFR: 97.58 mL/min (ref >60.00)
Glucose, Bld: 87 mg/dL (ref 70–99)
Potassium: 4 mEq/L (ref 3.5–5.1)
Sodium: 137 mEq/L (ref 135–145)
Total Protein: 7.1 g/dL (ref 6.0–8.3)

## 2012-04-16 LAB — LIPID PANEL
HDL: 29.1 mg/dL — ABNORMAL LOW (ref >39.00)
Total CHOL/HDL Ratio: 6
Triglycerides: 238 mg/dL — ABNORMAL HIGH (ref 0.0–149.0)

## 2012-04-16 LAB — LDL CHOLESTEROL, DIRECT: Direct LDL: 113.8 mg/dL

## 2012-04-16 MED ORDER — NORETHIN-ETH ESTRAD TRIPHASIC 0.5/0.75/1-35 MG-MCG PO TABS: ORAL_TABLET | ORAL | Status: DC

## 2012-04-16 MED ORDER — AZELASTINE HCL 0.15 % NA SOLN: 2.0000 | Freq: Two times a day (BID) | NASAL | Status: DC

## 2012-04-16 MED ORDER — MOMETASONE FUROATE 50 MCG/ACT NA SUSP: 2.0000 | Freq: Every day | NASAL | Status: DC

## 2012-04-16 MED ORDER — RAMIPRIL 5 MG PO CAPS: ORAL_CAPSULE | ORAL | Status: DC

## 2012-04-16 NOTE — Patient Instructions (Addendum)
Good to see you, Shari Armstrong. We will call you with your lab and pap smear results.

## 2012-04-16 NOTE — Addendum Note (Signed)
Addended by: Criselda Peaches B on: 04/16/2012 09:29 AM   Modules accepted: Orders

## 2012-04-16 NOTE — Progress Notes (Signed)
37 yo with h/o asthma here for CPX.  Asthma- uses her proair once or twice a month.  Always a little worse this time of year with seasonal allergies. Also uses Astepro and nasonex as needed for Allergic rhinitis and has been receiving allergy shots which has been helping significantly.  HTN- Has been on Altace 5 mg daily for over two years. No side effects. Lab Results  Component Value Date   CREATININE 0.8 08/09/2010    Well woman- GO, no h/o abnormal pap smears.  Last pap smear was in 07/2010- done here.  She would like it repeated today.  No family h/o cevical, breast or uterine cancer. Has been on ortho novum for years, no issues with it. Denies any dysuria, vaginal discharge or other symptoms.  Father has ALS but is doing well.  Followed at [facility] and is not rapidly progressing type. Patient Active Problem List  Diagnosis  . HYPERTENSION  . ALLERGIC RHINITIS, SEASONAL  . ASTHMA, MILD  . SEIZURE DISORDER, HX OF  . Routine general medical examination at a health care facility   Past Medical History  Diagnosis Date  . Hypertension    Past Surgical History  Procedure Laterality Date  . Wisdom tooth extraction      age 71   History  Substance Use Topics  . Smoking status: Never Smoker   . Smokeless tobacco: Not on file  . Alcohol Use: Not on file   Family History  Problem Relation Age of Onset  . Diabetes Mother     pre diabetic  . Obesity Mother   . Diabetes Father   . Hypertension Father   . Hyperlipidemia Father   . Diabetes Maternal Grandmother   . Diabetes Cousin   . Cancer Other     breast   No Known Allergies Current Outpatient Prescriptions on File Prior to Visit  Medication Sig Dispense Refill  . ALYACEN 7/7/7 0.5/0.75/1-35 MG-MCG tablet take 1 tablet by mouth once daily  28 tablet  1  . Azelastine HCl (ASTEPRO) 0.15 % SOLN 2 sprays by Nasal route 2 (two) times daily.        . fexofenadine (ALLEGRA) 180 MG tablet Take 180 mg by mouth daily.         . mometasone (NASONEX) 50 MCG/ACT nasal spray 2 sprays by Nasal route daily.        Marland Kitchen PROAIR HFA 108 (90 BASE) MCG/ACT inhaler INSTILL 2 SPRAYS EVERY 4 HOURS AS NEEDED FOR WHEEZING  8.5 g  5  . ramipril (ALTACE) 5 MG capsule take 1 capsule by mouth once daily  30 capsule  1   No current facility-administered medications on file prior to visit.    The PMH, PSH, Social History, Family History, Medications, and allergies have been reviewed in [facility], and have been updated if relevant.   Review of Systems  See HPI Patient reports no  vision/ hearing changes,anorexia, weight change, fever ,adenopathy, persistant / recurrent hoarseness, swallowing issues, chest pain, edema,persistant / recurrent cough, hemoptysis, dyspnea(rest, exertional, paroxysmal nocturnal), gastrointestinal  bleeding (melena, rectal bleeding), abdominal pain, excessive heart burn, GU symptoms(dysuria, hematuria, pyuria, voiding/incontinence  Issues) syncope, focal weakness, severe memory loss, concerning skin lesions, depression, anxiety, abnormal bruising/bleeding, major joint swelling, breast masses or abnormal vaginal bleeding.     Physical Exam BP 126/82  Pulse 89  Temp(Src) 97.8 F (36.6 C) (Oral)  Ht 5' 3.5" (1.613 m)  Wt 194 lb 4 oz (88.111 kg)  BMI 33.87 kg/m2  SpO2 98%  LMP 03/23/2012  General:  alert, well-developed, well-nourished, and well-hydrated.   Eyes:  vision grossly intact, pupils equal, and pupils round.   Ears:  R ear normal and L ear normal.   Mouth:  good dentition.   Neck:  No deformities, masses, or tenderness noted. Breasts:  No mass, nodules, thickening, tenderness, bulging, retraction, inflamation, nipple discharge or skin changes noted.   Lungs:  Normal respiratory effort, chest expands symmetrically. Lungs are clear to auscultation, no crackles or wheezes. Heart:  Normal rate and regular rhythm. S1 and S2 normal without gallop, murmur, click, rub or other extra sounds. Abdomen:  Bowel  sounds positive,abdomen soft and non-tender without masses, organomegaly or hernias noted. Genitalia:  Pelvic Exam:        External: normal female genitalia without lesions or masses        Vagina: normal without lesions or masses        Cervix: normal without lesions or masses        Adnexa: normal bimanual exam without masses or fullness        Uterus: normal by palpation        Pap smear: performed Extremities:  No clubbing, cyanosis, edema, or deformity noted with normal full range of motion of all joints.   Psych:  Cognition and judgment appear intact. Alert and cooperative with normal attention span and concentration. No apparent delusions, illusions, hallucinations  Assessment and Plan:  1. Routine general medical examination at a health care facility Reviewed preventive care protocols, scheduled due services, and updated immunizations Discussed nutrition, exercise, diet, and healthy lifestyle.  - Comprehensive metabolic panel -Pap smear  2. HYPERTENSION Well controlled on low dose Altace.  Rx refilled  - Comprehensive metabolic panel  3. Screening for ischemic heart disease - Lipid Panel

## 2012-04-18 ENCOUNTER — Encounter: Payer: Self-pay | Admitting: Family Medicine

## 2012-04-18 LAB — HM PAP SMEAR: HM Pap smear: NORMAL

## 2012-05-09 ENCOUNTER — Other Ambulatory Visit: Payer: Self-pay | Admitting: Family Medicine

## 2012-10-02 ENCOUNTER — Other Ambulatory Visit: Payer: Self-pay | Admitting: Family Medicine

## 2012-10-24 ENCOUNTER — Other Ambulatory Visit: Payer: Self-pay | Admitting: Family Medicine

## 2012-11-23 ENCOUNTER — Other Ambulatory Visit: Payer: Self-pay | Admitting: Family Medicine

## 2012-11-24 NOTE — Telephone Encounter (Signed)
Last office visit 04/16/2012.  Ok to refill?

## 2012-12-05 ENCOUNTER — Ambulatory Visit: Payer: BC Managed Care – PPO | Admitting: Family Medicine

## 2012-12-05 ENCOUNTER — Ambulatory Visit (INDEPENDENT_AMBULATORY_CARE_PROVIDER_SITE_OTHER): Payer: BC Managed Care – PPO | Admitting: Family Medicine

## 2012-12-05 ENCOUNTER — Encounter: Payer: Self-pay | Admitting: Family Medicine

## 2012-12-05 VITALS — BP 126/82 | HR 92 | Temp 98.1°F | Wt 193.8 lb

## 2012-12-05 DIAGNOSIS — J019 Acute sinusitis, unspecified: Secondary | ICD-10-CM | POA: Insufficient documentation

## 2012-12-05 MED ORDER — AMOXICILLIN-POT CLAVULANATE 875-125 MG PO TABS: 1.0000 | ORAL_TABLET | Freq: Two times a day (BID) | ORAL | Status: AC

## 2012-12-05 NOTE — Assessment & Plan Note (Signed)
Anticipate viral given short duration. Recommended more regular use of albuterol for next 3-4 days, as well as supportive care as per instructions. If not improving with above treatment, provided with WASP for augmentin. Pt agrees with plan. Red flags to return discussed.

## 2012-12-05 NOTE — Progress Notes (Signed)
  Subjective:    Patient ID: Shari Armstrong, female    DOB: [birth year], 37 y.o.   MRN: [id number]  HPI CC: "starts of sinus infection"  sxs started 4 days ago.  Having increased congestion, maxillary and frontal sinus pressure, hoarse voice, sore throat, green mucous when blowing nose, mild cough productive of green mucous, increased need for rescue albuterol inhaler.  + ear pain, PNdrainage.  Able to sleep at night ok.  + wheezy and dyspneic but improves with albuterol  No fevers/chills, abd pain, nausea, rashes, tooth pain.  H/o allergies to ragweed. H/o mild asthma. Teaches school, recent trouble with HVAC system  No sick contacts at home.  + diarrhea at school. No smokers at home.  Past Medical History  Diagnosis Date  . Hypertension      Review of Systems Per HPI    Objective:   Physical Exam  Nursing note and vitals reviewed. Constitutional: She appears well-developed and well-nourished. No distress.  HENT:  Head: Normocephalic and atraumatic.  Right Ear: Hearing, tympanic membrane, external ear and ear canal normal.  Left Ear: Hearing, tympanic membrane, external ear and ear canal normal.  Nose: Mucosal edema (R>L) present. No rhinorrhea. Right sinus exhibits maxillary sinus tenderness and frontal sinus tenderness. Left sinus exhibits maxillary sinus tenderness and frontal sinus tenderness.  Mouth/Throat: Uvula is midline, oropharynx is clear and moist and mucous membranes are normal. No oropharyngeal exudate, posterior oropharyngeal edema, posterior oropharyngeal erythema or tonsillar abscesses.  serous fluid behind R TM  Eyes: Conjunctivae and EOM are normal. Pupils are equal, round, and reactive to light. No scleral icterus.  Neck: Normal range of motion. Neck supple.  Cardiovascular: Normal rate, regular rhythm, normal heart sounds and intact distal pulses.   No murmur heard. Pulmonary/Chest: Effort normal and breath sounds normal. No respiratory distress. She has no  wheezes. She has no rales.  Lymphadenopathy:    She has no cervical adenopathy.  Skin: Skin is warm and dry. No rash noted.       Assessment & Plan:

## 2012-12-05 NOTE — Patient Instructions (Addendum)
You have a sinus infection, likely viral. Prescription for augmentin provided in case not improving as expected. Use albuterol more regularly for next several days - 3-4 times a day. Push fluids and plenty of rest. Nasal saline irrigation or neti pot to help drain sinuses. May use plain mucinex or immediate release guaifenesin with plenty of fluid to help mobilize mucous. Let us know if fever >101.5, trouble opening/closing mouth, difficulty swallowing, or worsening - you may need to be seen again.

## 2013-05-18 ENCOUNTER — Other Ambulatory Visit: Payer: Self-pay | Admitting: Family Medicine

## 2013-06-14 ENCOUNTER — Other Ambulatory Visit: Payer: Self-pay | Admitting: Family Medicine

## 2013-06-17 ENCOUNTER — Other Ambulatory Visit: Payer: Self-pay | Admitting: Family Medicine

## 2013-07-15 ENCOUNTER — Other Ambulatory Visit: Payer: Self-pay | Admitting: Family Medicine

## 2013-07-17 MED ORDER — RAMIPRIL 5 MG PO CAPS: ORAL_CAPSULE | ORAL | Status: DC

## 2013-07-17 NOTE — Telephone Encounter (Signed)
Pt left v/m requesting refill on ramipril to [facility]; pt has already scheduled CPX 07/2013. Pt did not require cb if could be refilled. Refill done # 30.

## 2013-07-17 NOTE — Addendum Note (Signed)
Addended by: Patience MuscaISLEY, RENA M on: 07/17/2013 04:34 PM   Modules accepted: Orders

## 2013-08-07 ENCOUNTER — Other Ambulatory Visit: Payer: Self-pay | Admitting: Family Medicine

## 2013-08-12 ENCOUNTER — Other Ambulatory Visit: Payer: Self-pay | Admitting: Family Medicine

## 2013-08-20 ENCOUNTER — Ambulatory Visit (INDEPENDENT_AMBULATORY_CARE_PROVIDER_SITE_OTHER): Payer: BC Managed Care – PPO | Admitting: Family Medicine

## 2013-08-20 ENCOUNTER — Telehealth: Payer: Self-pay | Admitting: Family Medicine

## 2013-08-20 ENCOUNTER — Encounter: Payer: Self-pay | Admitting: Family Medicine

## 2013-08-20 ENCOUNTER — Other Ambulatory Visit (HOSPITAL_COMMUNITY)
Admission: RE | Admit: 2013-08-20 | Discharge: 2013-08-20 | Disposition: A | Discharge status: Home / Self Care | Payer: BC Managed Care – PPO | Source: Ambulatory Visit | Attending: Family Medicine | Admitting: Family Medicine

## 2013-08-20 VITALS — BP 122/78 | HR 82 | Temp 97.7°F | Ht 63.75 in | Wt 193.5 lb

## 2013-08-20 DIAGNOSIS — Z01419 Encounter for gynecological examination (general) (routine) without abnormal findings: Secondary | ICD-10-CM | POA: Insufficient documentation

## 2013-08-20 DIAGNOSIS — R5381 Other malaise: Secondary | ICD-10-CM

## 2013-08-20 DIAGNOSIS — Z8669 Personal history of other diseases of the nervous system and sense organs: Secondary | ICD-10-CM

## 2013-08-20 DIAGNOSIS — Z1151 Encounter for screening for human papillomavirus (HPV): Secondary | ICD-10-CM | POA: Insufficient documentation

## 2013-08-20 DIAGNOSIS — F4321 Adjustment disorder with depressed mood: Secondary | ICD-10-CM

## 2013-08-20 DIAGNOSIS — J45909 Unspecified asthma, uncomplicated: Secondary | ICD-10-CM

## 2013-08-20 DIAGNOSIS — Z136 Encounter for screening for cardiovascular disorders: Secondary | ICD-10-CM

## 2013-08-20 DIAGNOSIS — Z0000 Encounter for general adult medical examination without abnormal findings: Secondary | ICD-10-CM

## 2013-08-20 DIAGNOSIS — I10 Essential (primary) hypertension: Secondary | ICD-10-CM

## 2013-08-20 DIAGNOSIS — R5383 Other fatigue: Secondary | ICD-10-CM

## 2013-08-20 LAB — CBC WITH DIFFERENTIAL/PLATELET
Basophils Absolute: 0.1 10*3/uL (ref 0.0–0.1)
Basophils Relative: 0.5 % (ref 0.0–3.0)
EOS PCT: 2.5 % (ref 0.0–5.0)
Eosinophils Absolute: 0.3 10*3/uL (ref 0.0–0.7)
HEMATOCRIT: 44 % (ref 36.0–46.0)
HEMOGLOBIN: 15 g/dL (ref 12.0–15.0)
LYMPHS ABS: 3.3 10*3/uL (ref 0.7–4.0)
LYMPHS PCT: 27.5 % (ref 12.0–46.0)
MCHC: 34 g/dL (ref 30.0–36.0)
MCV: 85.6 fl (ref 78.0–100.0)
MONOS PCT: 4.3 % (ref 3.0–12.0)
Monocytes Absolute: 0.5 10*3/uL (ref 0.1–1.0)
NEUTROS ABS: 7.9 10*3/uL — AB (ref 1.4–7.7)
Neutrophils Relative %: 65.2 % (ref 43.0–77.0)
Platelets: 337 10*3/uL (ref 150.0–400.0)
RBC: 5.13 Mil/uL — ABNORMAL HIGH (ref 3.87–5.11)
RDW: 12.8 % (ref 11.5–15.5)
WBC: 12.1 10*3/uL — AB (ref 4.0–10.5)

## 2013-08-20 LAB — COMPREHENSIVE METABOLIC PANEL
ALT: 64 U/L — AB (ref 0–35)
AST: 43 U/L — ABNORMAL HIGH (ref 0–37)
Albumin: 4.2 g/dL (ref 3.5–5.2)
Alkaline Phosphatase: 75 U/L (ref 39–117)
BUN: 13 mg/dL (ref 6–23)
CO2: 27 meq/L (ref 19–32)
CREATININE: 0.6 mg/dL (ref 0.4–1.2)
Calcium: 9.3 mg/dL (ref 8.4–10.5)
Chloride: 107 mEq/L (ref 96–112)
GFR: 110.95 mL/min (ref >60.00)
GLUCOSE: 107 mg/dL — AB (ref 70–99)
Potassium: 4.5 mEq/L (ref 3.5–5.1)
SODIUM: 140 meq/L (ref 135–145)
TOTAL PROTEIN: 6.9 g/dL (ref 6.0–8.3)
Total Bilirubin: 0.6 mg/dL (ref 0.2–1.2)

## 2013-08-20 LAB — LIPID PANEL
Cholesterol: 184 mg/dL (ref 0–200)
HDL: 31.2 mg/dL — ABNORMAL LOW (ref >39.00)
LDL Cholesterol: 108 mg/dL — ABNORMAL HIGH (ref 0–99)
NONHDL: 152.8
Total CHOL/HDL Ratio: 6
Triglycerides: 222 mg/dL — ABNORMAL HIGH (ref 0.0–149.0)
VLDL: 44.4 mg/dL — ABNORMAL HIGH (ref 0.0–40.0)

## 2013-08-20 LAB — VITAMIN B12: VITAMIN B 12: 293 pg/mL (ref 211–911)

## 2013-08-20 LAB — TSH: TSH: 1 u[IU]/mL (ref 0.35–4.50)

## 2013-08-20 MED ORDER — MOMETASONE FUROATE 50 MCG/ACT NA SUSP: 2.0000 | Freq: Every day | NASAL | Status: DC

## 2013-08-20 MED ORDER — AZELASTINE HCL 0.15 % NA SOLN: 2.0000 | Freq: Two times a day (BID) | NASAL | Status: DC

## 2013-08-20 MED ORDER — RAMIPRIL 5 MG PO CAPS: ORAL_CAPSULE | ORAL | Status: DC

## 2013-08-20 MED ORDER — NORETHIN-ETH ESTRAD TRIPHASIC 0.5/0.75/1-35 MG-MCG PO TABS: ORAL_TABLET | ORAL | Status: DC

## 2013-08-20 MED ORDER — ALBUTEROL SULFATE HFA 108 (90 BASE) MCG/ACT IN AERS: INHALATION_SPRAY | RESPIRATORY_TRACT | Status: DC

## 2013-08-20 NOTE — Progress Notes (Signed)
38 yo with h/o asthma here for CPX.  Unfortunately her dad passed away from ALS in March.  She has been attending grief counseling through hospice.  Feels she is coping ok "he is in a better place."  Denies changes in her appetite now.  Sleeping ok.  Very Engineer, technical salessupportive administrator at work.  Brother and mom have been dealing ok as well.  Asthma- has not had to use her proair since the beginning of the fall (her worse time of year for her allergies).  Also uses Astepro and nasonex as needed for Allergic rhinitis and has been receiving allergy shots which has been helping significantly.  HTN- Has been on Altace 5 mg daily for over two years. No side effects. Lab Results  Component Value Date   CREATININE 0.7 04/16/2012    Well woman- GO, no h/o abnormal pap smears.  Last pap smear was in 04/16/12- done here.  She would like it repeated today. Sexually active with one partner only.  No family h/o cevical, breast or uterine cancer. Has been on ortho novum for years, no issues with it. Denies any dysuria, vaginal discharge or other symptoms.   Patient Active Problem List   Diagnosis Date Noted  . Routine general medical examination at a health care facility 08/09/2010  . SEIZURE DISORDER, HX OF 03/25/2007  . HYPERTENSION 03/20/2007  . ALLERGIC RHINITIS, SEASONAL 03/20/2007  . ASTHMA, MILD 03/20/2007   Past Medical History  Diagnosis Date  . Hypertension    Past Surgical History  Procedure Laterality Date  . Wisdom tooth extraction      age 38   History  Substance Use Topics  . Smoking status: Never Smoker   . Smokeless tobacco: Not on file  . Alcohol Use: No   Family History  Problem Relation Age of Onset  . Diabetes Mother     pre diabetic  . Obesity Mother   . Diabetes Father   . Hypertension Father   . Hyperlipidemia Father   . ALS Father   . Diabetes Maternal Grandmother   . Diabetes Cousin   . Cancer Other     breast   No Known Allergies Current Outpatient  Prescriptions on File Prior to Visit  Medication Sig Dispense Refill  . ALYACEN 7/7/7 0.5/0.75/1-35 MG-MCG tablet take 1 tablet by mouth once daily  28 tablet  1  . Azelastine HCl (ASTEPRO) 0.15 % SOLN Place 2 sprays into the nose 2 (two) times daily.  30 mL  3  . fexofenadine (ALLEGRA) 180 MG tablet Take 180 mg by mouth daily.        . mometasone (NASONEX) 50 MCG/ACT nasal spray Place 2 sprays into the nose daily.  17 g  3  . PROAIR HFA 108 (90 BASE) MCG/ACT inhaler INSTILL 2 SPRAYS EVERY 4 HOURS AS NEEDED FOR WHEEZING  8.5 g  5  . ramipril (ALTACE) 5 MG capsule take 1 capsule by mouth once daily  30 capsule  0   No current facility-administered medications on file prior to visit.    The PMH, PSH, Social History, Family History, Medications, and allergies [facility], and have been updated if relevant.   Review of Systems  See HPI Patient reports no  vision/ hearing changes,anorexia, weight change, fever ,adenopathy, persistant / recurrent hoarseness, swallowing issues, chest pain, edema,persistant / recurrent cough, hemoptysis, dyspnea(rest, exertional, paroxysmal nocturnal), gastrointestinal  bleeding (melena, rectal bleeding), abdominal pain, excessive heart burn, GU symptoms(dysuria, hematuria, pyuria, voiding/incontinence  Issues) syncope,  focal weakness, severe memory loss, concerning skin lesions, depression, anxiety, abnormal bruising/bleeding, major joint swelling, breast masses or abnormal vaginal bleeding.     Physical Exam BP 122/78  Pulse 82  Temp(Src) 97.7 F (36.5 C) (Oral)  Ht 5' 3.75" (1.619 m)  Wt 193 lb 8 oz (87.771 kg)  BMI 33.49 kg/m2  SpO2 98%  LMP 08/10/2013 Wt Readings from Last 3 Encounters:  08/20/13 193 lb 8 oz (87.771 kg)  12/05/12 193 lb 12 oz (87.884 kg)  04/16/12 194 lb 4 oz (88.111 kg)     General:  alert, well-developed, well-nourished, and well-hydrated.   Eyes:  vision grossly intact, pupils equal, and pupils round.   Ears:  R  ear normal and L ear normal.   Mouth:  good dentition.   Neck:  No deformities, masses, or tenderness noted. Breasts:  No mass, nodules, thickening, tenderness, bulging, retraction, inflamation, nipple discharge or skin changes noted.   Lungs:  Normal respiratory effort, chest expands symmetrically. Lungs are clear to auscultation, no crackles or wheezes. Heart:  Normal rate and regular rhythm. S1 and S2 normal without gallop, murmur, click, rub or other extra sounds. Abdomen:  Bowel sounds positive,abdomen soft and non-tender without masses, organomegaly or hernias noted. Genitalia:  Pelvic Exam:        External: normal female genitalia without lesions or masses        Vagina: normal without lesions or masses        Cervix: normal without lesions or masses        Adnexa: normal bimanual exam without masses or fullness        Uterus: normal by palpation        Pap smear: performed Extremities:  No clubbing, cyanosis, edema, or deformity noted with normal full range of motion of all joints.   Psych:  Cognition and judgment appear intact. Alert and cooperative with normal attention span and concentration. No apparent delusions, illusions, hallucinations  Assessment and Plan:

## 2013-08-20 NOTE — Assessment & Plan Note (Signed)
Discussed USPSTF recommendations of cervical cancer screening.  She is aware that interval of 3 years is recommended but pt would prefer to have pap smear done today. Pap smear done. Pt declined STD screening.

## 2013-08-20 NOTE — Progress Notes (Signed)
Pre visit review using our clinic review tool, if applicable. No additional management support is needed unless otherwise documented below in the visit note. 

## 2013-08-20 NOTE — Assessment & Plan Note (Signed)
Seems to be coping ok. Continue grief counseling.

## 2013-08-20 NOTE — Addendum Note (Signed)
Addended by: Dianne DunARON, TALIA M on: 08/20/2013 08:58 AM   Modules accepted: Orders

## 2013-08-20 NOTE — Telephone Encounter (Signed)
Relevant patient education assigned to patient using Emmi. ° °

## 2013-08-20 NOTE — Patient Instructions (Signed)
Great to see you. I am so sorry for your loss. We will call you with your lab results.   

## 2013-08-20 NOTE — Addendum Note (Signed)
Addended by: Desmond DikeKNIGHT, Greydon Betke H on: 08/20/2013 08:56 AM   Modules accepted: Orders

## 2013-08-20 NOTE — Assessment & Plan Note (Signed)
Reviewed preventive care protocols, scheduled due services, and updated immunizations Discussed nutrition, exercise, diet, and healthy lifestyle.  Orders Placed This Encounter  Procedures  . CBC with Differential  . Comprehensive metabolic panel  . Lipid panel  . TSH  . Vitamin B12

## 2013-08-21 LAB — CYTOLOGY - PAP

## 2013-08-22 ENCOUNTER — Encounter: Payer: Self-pay | Admitting: *Deleted

## 2013-11-05 ENCOUNTER — Ambulatory Visit (INDEPENDENT_AMBULATORY_CARE_PROVIDER_SITE_OTHER): Payer: BC Managed Care – PPO | Admitting: Internal Medicine

## 2013-11-05 ENCOUNTER — Encounter: Payer: Self-pay | Admitting: Internal Medicine

## 2013-11-05 VITALS — BP 144/94 | HR 88 | Temp 98.0°F | Wt 197.0 lb

## 2013-11-05 DIAGNOSIS — H9201 Otalgia, right ear: Secondary | ICD-10-CM

## 2013-11-05 DIAGNOSIS — J029 Acute pharyngitis, unspecified: Secondary | ICD-10-CM

## 2013-11-05 DIAGNOSIS — H9209 Otalgia, unspecified ear: Secondary | ICD-10-CM

## 2013-11-05 MED ORDER — FLUOCINOLONE ACETONIDE 0.01 % OT OIL: 5.0000 [drp] | TOPICAL_OIL | Freq: Two times a day (BID) | OTIC | Status: DC

## 2013-11-05 NOTE — Patient Instructions (Addendum)
Otalgia  The most common reason for this in children is an infection of the middle ear. Pain from the middle ear is usually caused by a build-up of fluid and pressure behind the eardrum. Pain from an earache can be sharp, dull, or burning. The pain may be temporary or constant. The middle ear is connected to the nasal passages by a short narrow tube called the Eustachian tube. The Eustachian tube allows fluid to drain out of the middle ear, and helps keep the pressure in your ear equalized.  CAUSES   A cold or allergy can block the Eustachian tube with inflammation and the build-up of secretions. This is especially likely in small children, because their Eustachian tube is shorter and more horizontal. When the Eustachian tube closes, the normal flow of fluid from the middle ear is stopped. Fluid can accumulate and cause stuffiness, pain, hearing loss, and an ear infection if germs start growing in this area.  SYMPTOMS   The symptoms of an ear infection may include fever, ear pain, fussiness, increased crying, and irritability. Many children will have temporary and minor hearing loss during and right after an ear infection. Permanent hearing loss is rare, but the risk increases the more infections a child has. Other causes of ear pain include retained water in the outer ear canal from swimming and bathing.  Ear pain in adults is less likely to be from an ear infection. Ear pain may be referred from other locations. Referred pain may be from the joint between your jaw and the skull. It may also come from a tooth problem or problems in the neck. Other causes of ear pain include:   A foreign body in the ear.   Outer ear infection.   Sinus infections.   Impacted ear wax.   Ear injury.   Arthritis of the jaw or TMJ problems.   Middle ear infection.   Tooth infections.   Sore throat with pain to the ears.  DIAGNOSIS   Your caregiver can usually make the diagnosis by examining you. Sometimes other special studies,  including x-rays and lab work may be necessary.  TREATMENT    If antibiotics were prescribed, use them as directed and finish them even if you or your child's symptoms seem to be improved.   Sometimes PE tubes are needed in children. These are little plastic tubes which are put into the eardrum during a simple surgical procedure. They allow fluid to drain easier and allow the pressure in the middle ear to equalize. This helps relieve the ear pain caused by pressure changes.  HOME CARE INSTRUCTIONS    Only take over-the-counter or prescription medicines for pain, discomfort, or fever as directed by your caregiver. DO NOT GIVE CHILDREN ASPIRIN because of the association of Reye's Syndrome in children taking aspirin.   Use a cold pack applied to the outer ear for 15-20 minutes, 03-04 times per day or as needed may reduce pain. Do not apply ice directly to the skin. You may cause frost bite.   Over-the-counter ear drops used as directed may be effective. Your caregiver may sometimes prescribe ear drops.   Resting in an upright position may help reduce pressure in the middle ear and relieve pain.   Ear pain caused by rapidly descending from high altitudes can be relieved by swallowing or chewing gum. Allowing infants to suck on a bottle during airplane travel can help.   Do not smoke in the house or near children. If you are   unable to quit smoking, smoke outside.   Control allergies.  SEEK IMMEDIATE MEDICAL CARE IF:    You or your child are becoming sicker.   Pain or fever relief is not obtained with medicine.   You or your child's symptoms (pain, fever, or irritability) do not improve within 24 to 48 hours or as instructed.   Severe pain suddenly stops hurting. This may indicate a ruptured eardrum.   You or your children develop new problems such as severe headaches, stiff neck, difficulty swallowing, or swelling of the face or around the ear.  Document Released: 10/01/2003 Document Revised: 05/08/2011  Document Reviewed: 02/05/2008  ExitCare Patient Information 2015 [employer]. This information is not intended to replace advice given to you by your health care provider. Make sure you discuss any questions you have with your health care provider.

## 2013-11-05 NOTE — Progress Notes (Signed)
Subjective:    Patient ID: Shari Armstrong, female    DOB: 01-07-1976, 38 y.o.   MRN: [id number]  HPI Patient present with ear pain radiating down to her jaw and sore throat for the past  week  She used an OTC ear wax removal prior to pain starting. She as taken Tylenol and ibuprofen, hot compresses without relief. She has had some loss of hearing.  She reports a slight fever on Sunday.  Review of Systems  Past Medical History  Diagnosis Date  . Hypertension     Current Outpatient Prescriptions  Medication Sig Dispense Refill  . albuterol (PROAIR HFA) 108 (90 BASE) MCG/ACT inhaler INSTILL 2 SPRAYS EVERY 4 HOURS AS NEEDED FOR WHEEZING  8.5 g  5  . fexofenadine (ALLEGRA) 180 MG tablet Take 180 mg by mouth daily.        . norethindrone-ethinyl estradiol (ALYACEN 7/7/7) 0.5/0.75/1-35 MG-MCG tablet take 1 tablet by mouth once daily  28 tablet  11  . ramipril (ALTACE) 5 MG capsule take 1 capsule by mouth once daily  30 capsule  6  . Fluocinolone Acetonide 0.01 % OIL Place 5 drops in ear(s) 2 (two) times daily.  1 Bottle  0   No current facility-administered medications for this visit.    No Known Allergies  Family History  Problem Relation Age of Onset  . Diabetes Mother     pre diabetic  . Obesity Mother   . Diabetes Father   . Hypertension Father   . Hyperlipidemia Father   . ALS Father   . Diabetes Maternal Grandmother   . Diabetes Cousin   . Cancer Other     breast    History   Social History  . Marital Status: Single    Spouse Name: N/A    Number of Children: 0  . Years of Education: N/A   Occupational History  .  [employer]   Social History Main Topics  . Smoking status: Never Smoker   . Smokeless tobacco: Not on file  . Alcohol Use: Yes     Comment: occasional  . Drug Use: No  . Sexual Activity: Not on file   Other Topics Concern  . Not on file   Social History Narrative  . No narrative on file     Constitutional: Denies, malaise, fatigue or  headache   No other specific complaints in a complete review of systems (except as listed in HPI above).    .r Objective:   Physical Exam BP 144/94  Pulse 88  Temp(Src) 98 F (36.7 C) (Oral)  Wt 197 lb (89.359 kg)  SpO2 98% Wt Readings from Last 3 Encounters:  11/05/13 197 lb (89.359 kg)  08/20/13 193 lb 8 oz (87.771 kg)  12/05/12 193 lb 12 oz (87.884 kg)    General: Appears their stated age, well developed, well nourished in NAD.  HEENT: Ears: Tm gray and intact, normal light reflex on left, right TM is erythematous and friable; ; Throat/Mouth: Teeth present, mucosa pink and moist, no exudate, lesions or ulcerations noted.  Neck:Neck supple, trachea midline. No lymphadenopathy noted..  Cardiovascular: Normal rate and rhythm. S1,S2 noted.  No murmur, rubs or gallops noted. No JVD or BLE edema.  Pulmonary/Chest: Normal effort and positive vesicular breath sounds. No respiratory distress. No wheezes, rales or ronchi noted.    BMET    Component Value Date/Time   NA 140 08/20/2013 0904   K 4.5 08/20/2013 0904   CL 107 08/20/2013  0904   CO2 27 08/20/2013 0904   GLUCOSE 107* 08/20/2013 0904   BUN 13 08/20/2013 0904   CREATININE 0.6 08/20/2013 0904   CALCIUM 9.3 08/20/2013 0904   GFRNONAA 102.53 06/03/2009 0906   GFRAA 126 03/25/2007 0956    Lipid Panel     Component Value Date/Time   CHOL 184 08/20/2013 0904   TRIG 222.0* 08/20/2013 0904   HDL 31.20* 08/20/2013 0904   CHOLHDL 6 08/20/2013 0904   VLDL 44.4* 08/20/2013 0904   LDLCALC 108* 08/20/2013 0904    CBC    Component Value Date/Time   WBC 12.1* 08/20/2013 0904   RBC 5.13* 08/20/2013 0904   HGB 15.0 08/20/2013 0904   HCT 44.0 08/20/2013 0904   PLT 337.0 08/20/2013 0904   MCV 85.6 08/20/2013 0904   MCHC 34.0 08/20/2013 0904   RDW 12.8 08/20/2013 0904   LYMPHSABS 3.3 08/20/2013 0904   MONOABS 0.5 08/20/2013 0904   EOSABS 0.3 08/20/2013 0904   BASOSABS 0.1 08/20/2013 0904    Hgb A1C No results found for this basename: HGBA1C          Assessment & Plan:  1. Otalgia of right ear - Fluocinolone Acetonide 0.01 % OIL; Place 5 drops in ear(s) 2 (two) times daily.  Dispense: 1 Bottle; Refill: 0 Please call office if your symptoms worsen, or your experience discharge from the ear Tylenol as needed for pain

## 2013-11-05 NOTE — Progress Notes (Signed)
Subjective:    Patient ID: Shari Armstrong, female    DOB: July 30, 1975, 38 y.o.   MRN: [id number]  HPI  Pt presents to the clinic today with c/o right ear pain. This started 1 week ago. The pain does radiated into her jaw. She does have an associated sore throat and hearing loss in that ear. She denies fever, chills or body aches. She did try an ear wax removal kit at home without any relief. She also tried tylenol, ibuprofen and warm compresses.  Review of Systems      Past Medical History  Diagnosis Date  . Hypertension     Current Outpatient Prescriptions  Medication Sig Dispense Refill  . albuterol (PROAIR HFA) 108 (90 BASE) MCG/ACT inhaler INSTILL 2 SPRAYS EVERY 4 HOURS AS NEEDED FOR WHEEZING  8.5 g  5  . fexofenadine (ALLEGRA) 180 MG tablet Take 180 mg by mouth daily.        . norethindrone-ethinyl estradiol (ALYACEN 7/7/7) 0.5/0.75/1-35 MG-MCG tablet take 1 tablet by mouth once daily  28 tablet  11  . ramipril (ALTACE) 5 MG capsule take 1 capsule by mouth once daily  30 capsule  6   No current facility-administered medications for this visit.    No Known Allergies  Family History  Problem Relation Age of Onset  . Diabetes Mother     pre diabetic  . Obesity Mother   . Diabetes Father   . Hypertension Father   . Hyperlipidemia Father   . ALS Father   . Diabetes Maternal Grandmother   . Diabetes Cousin   . Cancer Other     breast    History   Social History  . Marital Status: Single    Spouse Name: N/A    Number of Children: 0  . Years of Education: N/A   Occupational History  .  [employer] History Main Topics  . Smoking status: Never Smoker   . Smokeless tobacco: Not on file  . Alcohol Use: Yes     Comment: occasional  . Drug Use: No  . Sexual Activity: Not on file   Other Topics Concern  . Not on file   Social History Narrative  . No narrative on file     Constitutional: Denies fever, malaise, fatigue, headache or abrupt weight  changes.  HEENT: Pt reports right ear pain and sore throat. Denies eye pain, eye redness, ringing in the ears, wax buildup, runny nose, nasal congestion, bloody nose. Respiratory: Denies difficulty breathing, shortness of breath, cough or sputum production.   Cardiovascular: Denies chest pain, chest tightness, palpitations or swelling in the hands or feet.    No other specific complaints in a complete review of systems (except as listed in HPI above).  Objective:   Physical Exam  BP 144/94  Pulse 88  Temp(Src) 98 F (36.7 C) (Oral)  Wt 197 lb (89.359 kg)  SpO2 98% Wt Readings from Last 3 Encounters:  11/05/13 197 lb (89.359 kg)  08/20/13 193 lb 8 oz (87.771 kg)  12/05/12 193 lb 12 oz (87.884 kg)    General: Appears her stated age, well developed, well nourished in NAD. HEENT:  Ears: Tm's gray and intact, normal light reflex; Nose: mucosa pink and moist, septum midline; Throat/Mouth: Teeth present, mucosa pink and moist, no exudate, lesions or ulcerations noted.   Cardiovascular: Normal rate and rhythm. S1,S2 noted.  No murmur, rubs or gallops noted. No JVD or BLE edema. No carotid bruits noted.  Pulmonary/Chest: Normal effort and positive vesicular breath sounds. No respiratory distress. No wheezes, rales or ronchi noted.   BMET    Component Value Date/Time   NA 140 08/20/2013 0904   K 4.5 08/20/2013 0904   CL 107 08/20/2013 0904   CO2 27 08/20/2013 0904   GLUCOSE 107* 08/20/2013 0904   BUN 13 08/20/2013 0904   CREATININE 0.6 08/20/2013 0904   CALCIUM 9.3 08/20/2013 0904   GFRNONAA 102.53 06/03/2009 0906   GFRAA 126 03/25/2007 0956    Lipid Panel     Component Value Date/Time   CHOL 184 08/20/2013 0904   TRIG 222.0* 08/20/2013 0904   HDL 31.20* 08/20/2013 0904   CHOLHDL 6 08/20/2013 0904   VLDL 44.4* 08/20/2013 0904   LDLCALC 108* 08/20/2013 0904    CBC    Component Value Date/Time   WBC 12.1* 08/20/2013 0904   RBC 5.13* 08/20/2013 0904   HGB 15.0 08/20/2013 0904   HCT 44.0  08/20/2013 0904   PLT 337.0 08/20/2013 0904   MCV 85.6 08/20/2013 0904   MCHC 34.0 08/20/2013 0904   RDW 12.8 08/20/2013 0904   LYMPHSABS 3.3 08/20/2013 0904   MONOABS 0.5 08/20/2013 0904   EOSABS 0.3 08/20/2013 0904   BASOSABS 0.1 08/20/2013 0904    Hgb A1C No results found for this basename: HGBA1C         Assessment & Plan:

## 2014-03-21 ENCOUNTER — Other Ambulatory Visit: Payer: Self-pay | Admitting: Family Medicine

## 2014-06-21 ENCOUNTER — Other Ambulatory Visit: Payer: Self-pay | Admitting: Family Medicine

## 2014-07-18 ENCOUNTER — Other Ambulatory Visit: Payer: Self-pay | Admitting: Family Medicine

## 2014-08-04 ENCOUNTER — Other Ambulatory Visit: Payer: Self-pay | Admitting: Family Medicine

## 2014-08-04 DIAGNOSIS — I10 Essential (primary) hypertension: Secondary | ICD-10-CM

## 2014-08-04 DIAGNOSIS — Z0000 Encounter for general adult medical examination without abnormal findings: Secondary | ICD-10-CM

## 2014-08-10 ENCOUNTER — Other Ambulatory Visit: Payer: Self-pay | Admitting: Family Medicine

## 2014-08-14 ENCOUNTER — Other Ambulatory Visit (INDEPENDENT_AMBULATORY_CARE_PROVIDER_SITE_OTHER): Payer: BC Managed Care – PPO

## 2014-08-14 DIAGNOSIS — Z0000 Encounter for general adult medical examination without abnormal findings: Secondary | ICD-10-CM | POA: Diagnosis not present

## 2014-08-14 LAB — COMPREHENSIVE METABOLIC PANEL
ALBUMIN: 3.9 g/dL (ref 3.5–5.2)
ALT: 22 U/L (ref 0–35)
AST: 18 U/L (ref 0–37)
Alkaline Phosphatase: 58 U/L (ref 39–117)
BUN: 10 mg/dL (ref 6–23)
CALCIUM: 9 mg/dL (ref 8.4–10.5)
CHLORIDE: 105 meq/L (ref 96–112)
CO2: 25 mEq/L (ref 19–32)
Creatinine, Ser: 0.71 mg/dL (ref 0.40–1.20)
GFR: 97.91 mL/min (ref >60.00)
Glucose, Bld: 97 mg/dL (ref 70–99)
POTASSIUM: 4.1 meq/L (ref 3.5–5.1)
Sodium: 136 mEq/L (ref 135–145)
Total Bilirubin: 0.4 mg/dL (ref 0.2–1.2)
Total Protein: 6.6 g/dL (ref 6.0–8.3)

## 2014-08-14 LAB — CBC WITH DIFFERENTIAL/PLATELET
Basophils Absolute: 0 10*3/uL (ref 0.0–0.1)
Basophils Relative: 0.3 % (ref 0.0–3.0)
Eosinophils Absolute: 0.3 10*3/uL (ref 0.0–0.7)
Eosinophils Relative: 2.7 % (ref 0.0–5.0)
HEMATOCRIT: 43.7 % (ref 36.0–46.0)
HEMOGLOBIN: 14.5 g/dL (ref 12.0–15.0)
Lymphocytes Relative: 30.8 % (ref 12.0–46.0)
Lymphs Abs: 3.7 10*3/uL (ref 0.7–4.0)
MCHC: 33.3 g/dL (ref 30.0–36.0)
MCV: 85.4 fl (ref 78.0–100.0)
Monocytes Absolute: 0.6 10*3/uL (ref 0.1–1.0)
Monocytes Relative: 5.1 % (ref 3.0–12.0)
Neutro Abs: 7.3 10*3/uL (ref 1.4–7.7)
Neutrophils Relative %: 61.1 % (ref 43.0–77.0)
Platelets: 339 10*3/uL (ref 150.0–400.0)
RBC: 5.11 Mil/uL (ref 3.87–5.11)
RDW: 13 % (ref 11.5–15.5)
WBC: 11.9 10*3/uL — ABNORMAL HIGH (ref 4.0–10.5)

## 2014-08-14 LAB — LIPID PANEL
CHOLESTEROL: 155 mg/dL (ref 0–200)
HDL: 27.4 mg/dL — ABNORMAL LOW (ref >39.00)
LDL CALC: 92 mg/dL (ref 0–99)
NonHDL: 127.6
Total CHOL/HDL Ratio: 6
Triglycerides: 180 mg/dL — ABNORMAL HIGH (ref 0.0–149.0)
VLDL: 36 mg/dL (ref 0.0–40.0)

## 2014-08-14 LAB — TSH: TSH: 1.52 u[IU]/mL (ref 0.35–4.50)

## 2014-08-24 ENCOUNTER — Encounter: Payer: Self-pay | Admitting: Family Medicine

## 2014-08-24 ENCOUNTER — Other Ambulatory Visit (HOSPITAL_COMMUNITY)
Admission: RE | Admit: 2014-08-24 | Discharge: 2014-08-24 | Disposition: A | Discharge status: Home / Self Care | Payer: BC Managed Care – PPO | Source: Ambulatory Visit | Attending: Family Medicine | Admitting: Family Medicine

## 2014-08-24 ENCOUNTER — Ambulatory Visit (INDEPENDENT_AMBULATORY_CARE_PROVIDER_SITE_OTHER): Payer: BC Managed Care – PPO | Admitting: Family Medicine

## 2014-08-24 VITALS — BP 114/78 | HR 81 | Temp 98.0°F | Ht 63.5 in | Wt 197.5 lb

## 2014-08-24 DIAGNOSIS — R87810 Cervical high risk human papillomavirus (HPV) DNA test positive: Secondary | ICD-10-CM | POA: Insufficient documentation

## 2014-08-24 DIAGNOSIS — N760 Acute vaginitis: Secondary | ICD-10-CM | POA: Insufficient documentation

## 2014-08-24 DIAGNOSIS — J45909 Unspecified asthma, uncomplicated: Secondary | ICD-10-CM

## 2014-08-24 DIAGNOSIS — Z01419 Encounter for gynecological examination (general) (routine) without abnormal findings: Secondary | ICD-10-CM | POA: Diagnosis not present

## 2014-08-24 DIAGNOSIS — G40909 Epilepsy, unspecified, not intractable, without status epilepticus: Secondary | ICD-10-CM

## 2014-08-24 DIAGNOSIS — Z113 Encounter for screening for infections with a predominantly sexual mode of transmission: Secondary | ICD-10-CM | POA: Insufficient documentation

## 2014-08-24 DIAGNOSIS — Z0000 Encounter for general adult medical examination without abnormal findings: Secondary | ICD-10-CM | POA: Diagnosis not present

## 2014-08-24 DIAGNOSIS — I10 Essential (primary) hypertension: Secondary | ICD-10-CM

## 2014-08-24 DIAGNOSIS — Z01411 Encounter for gynecological examination (general) (routine) with abnormal findings: Secondary | ICD-10-CM | POA: Insufficient documentation

## 2014-08-24 DIAGNOSIS — J301 Allergic rhinitis due to pollen: Secondary | ICD-10-CM

## 2014-08-24 DIAGNOSIS — Z1151 Encounter for screening for human papillomavirus (HPV): Secondary | ICD-10-CM | POA: Insufficient documentation

## 2014-08-24 MED ORDER — NORETHIN-ETH ESTRAD TRIPHASIC 0.5/0.75/1-35 MG-MCG PO TABS: 1.0000 | ORAL_TABLET | Freq: Every day | ORAL | Status: DC

## 2014-08-24 MED ORDER — RAMIPRIL 5 MG PO CAPS: 5.0000 mg | ORAL_CAPSULE | Freq: Every day | ORAL | Status: DC

## 2014-08-24 MED ORDER — ALBUTEROL SULFATE HFA 108 (90 BASE) MCG/ACT IN AERS: INHALATION_SPRAY | RESPIRATORY_TRACT | Status: DC

## 2014-08-24 NOTE — Addendum Note (Signed)
Addended by: Desmond Dike on: 08/24/2014 09:38 AM   Modules accepted: Orders

## 2014-08-24 NOTE — Assessment & Plan Note (Signed)
Well controlled.  No changes made. 

## 2014-08-24 NOTE — Assessment & Plan Note (Signed)
Discussed USPSTF recommendations of cervical cancer screening.  She is aware that interval of 3 years is recommended but pt would prefer to have pap smear done today.  

## 2014-08-24 NOTE — Assessment & Plan Note (Signed)
Reviewed preventive care protocols, scheduled due services, and updated immunizations Discussed nutrition, exercise, diet, and healthy lifestyle.  

## 2014-08-24 NOTE — Progress Notes (Signed)
Pre visit review using our clinic review tool, if applicable. No additional management support is needed unless otherwise documented below in the visit note. 

## 2014-08-24 NOTE — Assessment & Plan Note (Signed)
Well controlled on current rx. No changes made. 

## 2014-08-24 NOTE — Progress Notes (Signed)
39 yo pleasant female with h/o asthma here for CPX and follow up of chronic medical conditions.  Asthma- has not had to use her proair since the beginning of spring.  Allergies shots have helped tremendously.     Now using Dymista as needed for Allergic rhinitis and has been receiving allergy shots which has been helping significantly.  HTN- Has been on Altace 5 mg daily for over two years. No side effects. Lab Results  Component Value Date   CREATININE 0.71 08/14/2014   Lab Results  Component Value Date   CHOL 155 08/14/2014   HDL 27.40* 08/14/2014   LDLCALC 92 08/14/2014   LDLDIRECT 113.8 04/16/2012   TRIG 180.0* 08/14/2014   CHOLHDL 6 08/14/2014   Lab Results  Component Value Date   TSH 1.52 08/14/2014   Lab Results  Component Value Date   WBC 11.9* 08/14/2014   HGB 14.5 08/14/2014   HCT 43.7 08/14/2014   MCV 85.4 08/14/2014   PLT 339.0 08/14/2014     Well woman- GO, no h/o abnormal pap smears.  Last pap smear was in 08/20/13-done by me.  She would like it repeated today. Sexually active with one partner only.  No family h/o cevical, breast or uterine cancer.  Denies any dysuria, vaginal discharge or other symptoms.  Feels she is coping well with her father's death from last year.  Patient Active Problem List   Diagnosis Date Noted  . Grief 08/20/2013  . Other malaise and fatigue 08/20/2013  . Encounter for routine gynecological examination 08/20/2013  . Routine general medical examination at a health care facility 08/09/2010  . Seizure disorder 03/25/2007  . Essential hypertension 03/20/2007  . ALLERGIC RHINITIS, SEASONAL 03/20/2007  . Asthma 03/20/2007   Past Medical History  Diagnosis Date  . Hypertension    Past Surgical History  Procedure Laterality Date  . Wisdom tooth extraction      age 24   History  Substance Use Topics  . Smoking status: Never Smoker   . Smokeless tobacco: Not on file  . Alcohol Use: Yes     Comment: occasional    Family History  Problem Relation Age of Onset  . Diabetes Mother     pre diabetic  . Obesity Mother   . Diabetes Father   . Hypertension Father   . Hyperlipidemia Father   . ALS Father   . Diabetes Maternal Grandmother   . Diabetes Cousin   . Cancer Other     breast   No Known Allergies Current Outpatient Prescriptions on File Prior to Visit  Medication Sig Dispense Refill  . fexofenadine (ALLEGRA) 180 MG tablet Take 180 mg by mouth daily.       No current facility-administered medications on file prior to visit.    The PMH, PSH, Social History, Family History, Medications, and allergies have been reviewed in [facility], and have been updated if relevant.    Review of Systems    Physical Exam BP 114/78 mmHg  Pulse 81  Temp(Src) 98 F (36.7 C) (Oral)  Ht 5' 3.5" (1.613 m)  Wt 197 lb 8 oz (89.585 kg)  BMI 34.43 kg/m2  SpO2 98%  LMP 08/16/2014 Wt Readings from Last 3 Encounters:  08/24/14 197 lb 8 oz (89.585 kg)  11/05/13 197 lb (89.359 kg)  08/20/13 193 lb 8 oz (87.771 kg)    General:  Well-developed,well-nourished,in no acute distress; alert,appropriate and cooperative throughout examination Head:  normocephalic and atraumatic.   Eyes:  vision grossly intact,  pupils equal, pupils round, and pupils reactive to light.   Ears:  R ear normal and L ear normal.   Nose:  no external deformity.   Mouth:  good dentition.   Neck:  No deformities, masses, or tenderness noted. Breasts:  No mass, nodules, thickening, tenderness, bulging, retraction, inflamation, nipple discharge or skin changes noted.   Lungs:  Normal respiratory effort, chest expands symmetrically. Lungs are clear to auscultation, no crackles or wheezes. Heart:  Normal rate and regular rhythm. S1 and S2 normal without gallop, murmur, click, rub or other extra sounds. Abdomen:  Bowel sounds positive,abdomen soft and non-tender without masses, organomegaly or hernias noted. Rectal:  no external abnormalities.    Genitalia:  Pelvic Exam:        External: normal female genitalia without lesions or masses        Vagina: normal without lesions or masses        Cervix: normal without lesions or masses        Adnexa: normal bimanual exam without masses or fullness        Uterus: normal by palpation        Pap smear: performed Msk:  No deformity or scoliosis noted of thoracic or lumbar spine.   Extremities:  No clubbing, cyanosis, edema, or deformity noted with normal full range of motion of all joints.   Neurologic:  alert & oriented X3 and gait normal.   Skin:  Intact without suspicious lesions or rashes Cervical Nodes:  No lymphadenopathy noted Axillary Nodes:  No palpable lymphadenopathy Psych:  Cognition and judgment appear intact. Alert and cooperative with normal attention span and concentration. No apparent delusions, illusions, hallucinations

## 2014-08-24 NOTE — Assessment & Plan Note (Signed)
Improved with allergy shots and Dymista.  No changes made today.

## 2014-08-24 NOTE — Patient Instructions (Signed)
Great to see you. Have a good summer. 

## 2014-08-25 LAB — CYTOLOGY - PAP

## 2014-08-26 LAB — CERVICOVAGINAL ANCILLARY ONLY
Bacterial vaginitis: NEGATIVE
CANDIDA VAGINITIS: NEGATIVE

## 2014-08-28 LAB — CERVICOVAGINAL ANCILLARY ONLY: HERPES (WINDOWPATH): NEGATIVE

## 2014-12-09 ENCOUNTER — Encounter: Payer: Self-pay | Admitting: Family Medicine

## 2014-12-09 ENCOUNTER — Ambulatory Visit (INDEPENDENT_AMBULATORY_CARE_PROVIDER_SITE_OTHER): Payer: BC Managed Care – PPO | Admitting: Family Medicine

## 2014-12-09 VITALS — BP 126/88 | HR 83 | Temp 97.7°F | Wt 198.5 lb

## 2014-12-09 DIAGNOSIS — R21 Rash and other nonspecific skin eruption: Secondary | ICD-10-CM | POA: Insufficient documentation

## 2014-12-09 MED ORDER — FLUOCINONIDE 0.05 % EX SOLN: 1.0000 "application " | Freq: Two times a day (BID) | CUTANEOUS | Status: DC

## 2014-12-09 MED ORDER — FLUOCINONIDE-E 0.05 % EX CREA: 1.0000 "application " | TOPICAL_CREAM | Freq: Two times a day (BID) | CUTANEOUS | Status: DC

## 2014-12-09 NOTE — Assessment & Plan Note (Signed)
New- consistent with allergic dermatitis likely secondary to new laundry detergent. Advised washing clothes in her usual laundry detergent. Lidex erx sent to be used twice daily for no longer than 1 week without contacting me. Continue oral antihistamines as needed for itching. Call or return to clinic prn if these symptoms worsen or fail to improve as anticipated. The patient indicates understanding of these issues and agrees with the plan.

## 2014-12-09 NOTE — Progress Notes (Signed)
Subjective:   Patient ID: Shari Armstrong, female    DOB: 04/12/1975, 39 y.o.   MRN: [id number]  Shari Armstrong is a pleasant 39 y.o. year old female who presents to clinic today with Rash  on 12/09/2014  HPI:  Very itchy rash for past week- started on upper arms bilaterally and now on upper legs bilaterally.  Did use her mom's washer and dryer and detergent (different from what she normally uses), the days prior to this starting.  Benadryl does help with the itching.  NO other new soaps or lotions.  Non weeping.  Not painful.  No new pets.  No new rxs.  No SOB or wheezing.  She has no known allergies. Current Outpatient Prescriptions on File Prior to Visit  Medication Sig Dispense Refill  . albuterol (PROAIR HFA) 108 (90 BASE) MCG/ACT inhaler INSTILL 2 SPRAYS EVERY 4 HOURS AS NEEDED FOR WHEEZING 8.5 g 5  . Azelastine-Fluticasone (DYMISTA) 137-50 MCG/ACT SUSP Place into the nose.    . fexofenadine (ALLEGRA) 180 MG tablet Take 180 mg by mouth daily.      . norethindrone-ethinyl estradiol (DASETTA 7/7/7) 0.5/0.75/1-35 MG-MCG tablet Take 1 tablet by mouth daily. 28 tablet 11  . ramipril (ALTACE) 5 MG capsule Take 1 capsule (5 mg total) by mouth daily. 30 capsule 5   No current facility-administered medications on file prior to visit.    No Known Allergies  Past Medical History  Diagnosis Date  . Hypertension     Past Surgical History  Procedure Laterality Date  . Wisdom tooth extraction      age 39    Family History  Problem Relation Age of Onset  . Diabetes Mother     pre diabetic  . Obesity Mother   . Diabetes Father   . Hypertension Father   . Hyperlipidemia Father   . ALS Father   . Diabetes Maternal Grandmother   . Diabetes Cousin   . Cancer Other     breast    Social History   Social History  . Marital Status: Single    Spouse Name: N/A  . Number of Children: 0  . Years of Education: N/A   Occupational History  .  [employer]   Social  History Main Topics  . Smoking status: Never Smoker   . Smokeless tobacco: Not on file  . Alcohol Use: Yes     Comment: occasional  . Drug Use: No  . Sexual Activity: Not on file   Other Topics Concern  . Not on file   Social History Narrative   .reivewed  Review of Systems  Constitutional: Negative.   HENT: Negative.   Respiratory: Negative.   Cardiovascular: Negative.   Gastrointestinal: Negative.   Genitourinary: Negative.   Musculoskeletal: Negative.   Skin: Positive for rash.  Hematological: Negative.   Psychiatric/Behavioral: Negative.   All other systems reviewed and are negative.      Objective:    BP 126/88 mmHg  Pulse 83  Temp(Src) 97.7 F (36.5 C) (Oral)  Wt 198 lb 8 oz (90.039 kg)  SpO2 97%  LMP 12/04/2014   Physical Exam  Constitutional: She is oriented to person, place, and time. She appears well-developed and well-nourished. No distress.  HENT:  Head: Normocephalic and atraumatic.  Eyes: Conjunctivae are normal.  Cardiovascular: Normal rate.   Pulmonary/Chest: Effort normal.  Neurological: She is alert and oriented to person, place, and time. No cranial nerve deficit.  Skin: Skin is warm. Rash noted.  Rash is maculopapular.  Psychiatric: She has a normal mood and affect. Her behavior is normal. Judgment and thought content normal.  Nursing note and vitals reviewed.         Assessment & Plan:   Rash and nonspecific skin eruption No Follow-up on file.

## 2014-12-09 NOTE — Patient Instructions (Signed)
Great to see you. Please re wash towels.  Lidex as need- twice daily for no more than 1 week without call me.  Ok to continue Benadryl for itching.

## 2014-12-09 NOTE — Progress Notes (Signed)
Pre visit review using our clinic review tool, if applicable. No additional management support is needed unless otherwise documented below in the visit note. 

## 2014-12-15 ENCOUNTER — Telehealth: Payer: Self-pay | Admitting: Family Medicine

## 2014-12-15 DIAGNOSIS — R21 Rash and other nonspecific skin eruption: Secondary | ICD-10-CM

## 2014-12-15 NOTE — Telephone Encounter (Signed)
Left message for patient to return my call.

## 2014-12-15 NOTE — Telephone Encounter (Signed)
Pt called to let you know her rash is not any better.  And she would like to know what she needs to do next She thinks she is allergic to ragweed.  After she got allergy shot last week rash got worse

## 2014-12-15 NOTE — Telephone Encounter (Signed)
Please advise 

## 2014-12-15 NOTE — Telephone Encounter (Signed)
Has she been to her allergist about this rash yet?

## 2014-12-16 NOTE — Telephone Encounter (Signed)
Spoke to pt and advised. Pt is agreeable to referral and ask that if you have any recommendations as to who she should see, she is agreeable to that, as well.

## 2014-12-16 NOTE — Telephone Encounter (Signed)
Referral placed.

## 2014-12-16 NOTE — Telephone Encounter (Signed)
Per [name], pt has been spoken to ENT and they do not believe she is having an allergic reaction

## 2014-12-16 NOTE — Telephone Encounter (Signed)
Does she have a dermatologist?  At this point, that is what I recommend.

## 2015-02-20 ENCOUNTER — Other Ambulatory Visit: Payer: Self-pay | Admitting: Family Medicine

## 2015-08-05 ENCOUNTER — Other Ambulatory Visit: Payer: Self-pay | Admitting: Family Medicine

## 2015-08-06 ENCOUNTER — Other Ambulatory Visit: Payer: Self-pay | Admitting: Internal Medicine

## 2015-08-06 DIAGNOSIS — Z0000 Encounter for general adult medical examination without abnormal findings: Secondary | ICD-10-CM

## 2015-08-07 ENCOUNTER — Other Ambulatory Visit: Payer: Self-pay | Admitting: Family Medicine

## 2015-08-16 ENCOUNTER — Other Ambulatory Visit (INDEPENDENT_AMBULATORY_CARE_PROVIDER_SITE_OTHER): Payer: BC Managed Care – PPO

## 2015-08-16 DIAGNOSIS — Z0000 Encounter for general adult medical examination without abnormal findings: Secondary | ICD-10-CM | POA: Diagnosis not present

## 2015-08-16 LAB — CBC WITH DIFFERENTIAL/PLATELET
BASOS PCT: 0.4 % (ref 0.0–3.0)
Basophils Absolute: 0 10*3/uL (ref 0.0–0.1)
EOS ABS: 0.4 10*3/uL (ref 0.0–0.7)
Eosinophils Relative: 3.9 % (ref 0.0–5.0)
HCT: 43.1 % (ref 36.0–46.0)
HEMOGLOBIN: 14.6 g/dL (ref 12.0–15.0)
LYMPHS ABS: 4.1 10*3/uL — AB (ref 0.7–4.0)
Lymphocytes Relative: 37.3 % (ref 12.0–46.0)
MCHC: 33.8 g/dL (ref 30.0–36.0)
MCV: 84.3 fl (ref 78.0–100.0)
MONO ABS: 0.6 10*3/uL (ref 0.1–1.0)
Monocytes Relative: 5.3 % (ref 3.0–12.0)
NEUTROS PCT: 53.1 % (ref 43.0–77.0)
Neutro Abs: 5.8 10*3/uL (ref 1.4–7.7)
PLATELETS: 322 10*3/uL (ref 150.0–400.0)
RBC: 5.11 Mil/uL (ref 3.87–5.11)
RDW: 13.1 % (ref 11.5–15.5)
WBC: 11 10*3/uL — AB (ref 4.0–10.5)

## 2015-08-16 LAB — TSH: TSH: 1.45 u[IU]/mL (ref 0.35–4.50)

## 2015-08-16 LAB — LIPID PANEL
CHOL/HDL RATIO: 6
Cholesterol: 175 mg/dL (ref 0–200)
HDL: 29.8 mg/dL — AB (ref >39.00)
NONHDL: 144.95
Triglycerides: 233 mg/dL — ABNORMAL HIGH (ref 0.0–149.0)
VLDL: 46.6 mg/dL — ABNORMAL HIGH (ref 0.0–40.0)

## 2015-08-16 LAB — COMPREHENSIVE METABOLIC PANEL
ALBUMIN: 3.8 g/dL (ref 3.5–5.2)
ALT: 34 U/L (ref 0–35)
AST: 24 U/L (ref 0–37)
Alkaline Phosphatase: 68 U/L (ref 39–117)
BUN: 12 mg/dL (ref 6–23)
CHLORIDE: 106 meq/L (ref 96–112)
CO2: 22 meq/L (ref 19–32)
CREATININE: 0.65 mg/dL (ref 0.40–1.20)
Calcium: 9 mg/dL (ref 8.4–10.5)
GFR: 107.83 mL/min (ref >60.00)
GLUCOSE: 92 mg/dL (ref 70–99)
Potassium: 4.5 mEq/L (ref 3.5–5.1)
SODIUM: 139 meq/L (ref 135–145)
Total Bilirubin: 0.3 mg/dL (ref 0.2–1.2)
Total Protein: 6.7 g/dL (ref 6.0–8.3)

## 2015-08-16 LAB — LDL CHOLESTEROL, DIRECT: LDL DIRECT: 129 mg/dL

## 2015-08-25 ENCOUNTER — Encounter: Payer: BC Managed Care – PPO | Admitting: Family Medicine

## 2015-09-07 ENCOUNTER — Encounter: Payer: Self-pay | Admitting: Family Medicine

## 2015-09-07 ENCOUNTER — Other Ambulatory Visit (HOSPITAL_COMMUNITY)
Admission: RE | Admit: 2015-09-07 | Discharge: 2015-09-07 | Disposition: A | Discharge status: Home / Self Care | Payer: BC Managed Care – PPO | Source: Ambulatory Visit | Attending: Family Medicine | Admitting: Family Medicine

## 2015-09-07 ENCOUNTER — Ambulatory Visit (INDEPENDENT_AMBULATORY_CARE_PROVIDER_SITE_OTHER): Payer: BC Managed Care – PPO | Admitting: Family Medicine

## 2015-09-07 VITALS — BP 156/98 | HR 62 | Temp 97.9°F | Ht 63.0 in | Wt 207.0 lb

## 2015-09-07 DIAGNOSIS — Z0000 Encounter for general adult medical examination without abnormal findings: Secondary | ICD-10-CM | POA: Insufficient documentation

## 2015-09-07 DIAGNOSIS — R87810 Cervical high risk human papillomavirus (HPV) DNA test positive: Secondary | ICD-10-CM | POA: Diagnosis present

## 2015-09-07 DIAGNOSIS — J301 Allergic rhinitis due to pollen: Secondary | ICD-10-CM | POA: Diagnosis not present

## 2015-09-07 DIAGNOSIS — Z113 Encounter for screening for infections with a predominantly sexual mode of transmission: Secondary | ICD-10-CM | POA: Insufficient documentation

## 2015-09-07 DIAGNOSIS — N760 Acute vaginitis: Secondary | ICD-10-CM | POA: Diagnosis present

## 2015-09-07 DIAGNOSIS — Z1151 Encounter for screening for human papillomavirus (HPV): Secondary | ICD-10-CM | POA: Diagnosis present

## 2015-09-07 DIAGNOSIS — I10 Essential (primary) hypertension: Secondary | ICD-10-CM

## 2015-09-07 DIAGNOSIS — Z01419 Encounter for gynecological examination (general) (routine) without abnormal findings: Secondary | ICD-10-CM | POA: Insufficient documentation

## 2015-09-07 MED ORDER — RAMIPRIL 5 MG PO CAPS: 5.0000 mg | ORAL_CAPSULE | Freq: Every day | ORAL | Status: DC

## 2015-09-07 MED ORDER — NORETHIN-ETH ESTRAD TRIPHASIC 0.5/0.75/1-35 MG-MCG PO TABS: 1.0000 | ORAL_TABLET | Freq: Every day | ORAL | Status: DC

## 2015-09-07 NOTE — Assessment & Plan Note (Signed)
Reviewed preventive care protocols, scheduled due services, and updated immunizations Discussed nutrition, exercise, diet, and healthy lifestyle.  

## 2015-09-07 NOTE — Addendum Note (Signed)
Addended by: Desmond DikeKNIGHT, Anasha Perfecto H on: 09/07/2015 01:48 PM   Modules accepted: Orders, SmartSet

## 2015-09-07 NOTE — Assessment & Plan Note (Signed)
Discussed USPSTF recommendations of cervical cancer screening.  She is aware that interval of 3 years is recommended but pt would prefer to have pap smear done today.  

## 2015-09-07 NOTE — Progress Notes (Signed)
Pre visit review using our clinic review tool, if applicable. No additional management support is needed unless otherwise documented below in the visit note. 

## 2015-09-07 NOTE — Assessment & Plan Note (Signed)
Mildlly elevated but asymptomatic. She will monitor BP at home and keep me updated. No changes made.

## 2015-09-07 NOTE — Progress Notes (Signed)
40 yo pleasant female with h/o asthma here for CPX and follow up of chronic medical conditions.  Asthma- Allergies shots have helped tremendously.     Now using Dymista as needed for Allergic rhinitis and has been receiving allergy shots which has been helping significantly.  HTN- Has been on Altace 5 mg daily for over two years. No side effects but BP elevated today.  Has had a few more headaches over the past few months, otherwise does not feel like her BP has been elevated.. Lab Results  Component Value Date   CREATININE 0.65 08/16/2015   Lab Results  Component Value Date   CHOL 175 08/16/2015   HDL 29.80* 08/16/2015   LDLCALC 92 08/14/2014   LDLDIRECT 129.0 08/16/2015   TRIG 233.0* 08/16/2015   CHOLHDL 6 08/16/2015   Lab Results  Component Value Date   TSH 1.45 08/16/2015   Lab Results  Component Value Date   WBC 11.0* 08/16/2015   HGB 14.6 08/16/2015   HCT 43.1 08/16/2015   MCV 84.3 08/16/2015   PLT 322.0 08/16/2015     Well woman- GO, no h/o abnormal pap smears.  Last pap smear was in 08/23/24-done by me.  She would like it repeated today. Sexually active with one partner only.  No family h/o cevical, breast or uterine cancer.  Denies any dysuria, vaginal discharge or other symptoms.   Patient Active Problem List   Diagnosis Date Noted  . Well woman exam 09/07/2015  . Seizure disorder (HCC) 03/25/2007  . Essential hypertension 03/20/2007  . ALLERGIC RHINITIS, SEASONAL 03/20/2007  . Asthma 03/20/2007   Past Medical History  Diagnosis Date  . Hypertension    Past Surgical History  Procedure Laterality Date  . Wisdom tooth extraction      age 62   Social History  Substance Use Topics  . Smoking status: Never Smoker   . Smokeless tobacco: None  . Alcohol Use: Yes     Comment: occasional   Family History  Problem Relation Age of Onset  . Diabetes Mother     pre diabetic  . Obesity Mother   . Diabetes Father   . Hypertension Father   .  Hyperlipidemia Father   . ALS Father   . Diabetes Maternal Grandmother   . Diabetes Cousin   . Cancer Other     breast   No Known Allergies Current Outpatient Prescriptions on File Prior to Visit  Medication Sig Dispense Refill  . albuterol (PROAIR HFA) 108 (90 BASE) MCG/ACT inhaler INSTILL 2 SPRAYS EVERY 4 HOURS AS NEEDED FOR WHEEZING 8.5 g 5  . Azelastine-Fluticasone (DYMISTA) 137-50 MCG/ACT SUSP Place into the nose.    . fexofenadine (ALLEGRA) 180 MG tablet Take 180 mg by mouth daily.      . fluocinonide (LIDEX) 0.05 % external solution Apply 1 application topically 2 (two) times daily. 60 mL 0  . fluocinonide-emollient (LIDEX-E) 0.05 % cream Apply 1 application topically 2 (two) times daily. 30 g 0   No current facility-administered medications on file prior to visit.    The PMH, PSH, Social History, Family History, Medications, and allergies have been reviewed in [facility], and have been updated if relevant.    Review of Systems  Constitutional: Negative.   Eyes: Negative.   Respiratory: Negative.   Cardiovascular: Negative.   Gastrointestinal: Negative.   Endocrine: Negative.   Genitourinary: Negative.   Musculoskeletal: Negative.   Skin: Negative.   Allergic/Immunologic: Negative.   Neurological: Positive for headaches. Negative for  dizziness, seizures, facial asymmetry, speech difficulty, light-headedness and numbness.  Hematological: Negative.   Psychiatric/Behavioral: Negative.       Physical Exam BP 156/98 mmHg  Pulse 62  Temp(Src) 97.9 F (36.6 C) (Oral)  Ht 5\' 3"  (1.6 m)  Wt 207 lb (93.895 kg)  BMI 36.68 kg/m2  SpO2 98%  LMP 08/14/2015 Wt Readings from Last 3 Encounters:  09/07/15 207 lb (93.895 kg)  12/09/14 198 lb 8 oz (90.039 kg)  08/24/14 197 lb 8 oz (89.585 kg)    General:  Well-developed,well-nourished,in no acute distress; alert,appropriate and cooperative throughout examination Head:  normocephalic and atraumatic.   Eyes:  vision grossly  intact, pupils equal, pupils round, and pupils reactive to light.   Ears:  R ear normal and L ear normal.   Nose:  no external deformity.   Mouth:  good dentition.   Neck:  No deformities, masses, or tenderness noted. Breasts:  No mass, nodules, thickening, tenderness, bulging, retraction, inflamation, nipple discharge or skin changes noted.   Lungs:  Normal respiratory effort, chest expands symmetrically. Lungs are clear to auscultation, no crackles or wheezes. Heart:  Normal rate and regular rhythm. S1 and S2 normal without gallop, murmur, click, rub or other extra sounds. Abdomen:  Bowel sounds positive,abdomen soft and non-tender without masses, organomegaly or hernias noted. Rectal:  no external abnormalities.   Genitalia:  Pelvic Exam:        External: normal female genitalia without lesions or masses        Vagina: normal without lesions or masses        Cervix: normal without lesions or masses        Adnexa: normal bimanual exam without masses or fullness        Uterus: normal by palpation        Pap smear: performed Msk:  No deformity or scoliosis noted of thoracic or lumbar spine.   Extremities:  No clubbing, cyanosis, edema, or deformity noted with normal full range of motion of all joints.   Neurologic:  alert & oriented X3 and gait normal.   Skin:  Intact without suspicious lesions or rashes Cervical Nodes:  No lymphadenopathy noted Axillary Nodes:  No palpable lymphadenopathy Psych:  Cognition and judgment appear intact. Alert and cooperative with normal attention span and concentration. No apparent delusions, illusions, hallucinations

## 2015-09-07 NOTE — Patient Instructions (Signed)
Great to see you. Please check your blood pressures at home and keep me updated.

## 2015-09-08 LAB — CYTOLOGY - PAP

## 2015-09-09 ENCOUNTER — Encounter: Payer: Self-pay | Admitting: *Deleted

## 2015-09-10 LAB — CERVICOVAGINAL ANCILLARY ONLY
Bacterial vaginitis: NEGATIVE
Candida vaginitis: NEGATIVE

## 2015-09-13 LAB — CERVICOVAGINAL ANCILLARY ONLY: HERPES (WINDOWPATH): NEGATIVE

## 2015-09-26 ENCOUNTER — Encounter: Payer: Self-pay | Admitting: Family Medicine

## 2015-12-08 ENCOUNTER — Encounter: Payer: Self-pay | Admitting: Primary Care

## 2015-12-08 ENCOUNTER — Ambulatory Visit (INDEPENDENT_AMBULATORY_CARE_PROVIDER_SITE_OTHER): Payer: BC Managed Care – PPO | Admitting: Primary Care

## 2015-12-08 VITALS — BP 124/86 | HR 82 | Temp 98.2°F | Ht 63.0 in | Wt 209.0 lb

## 2015-12-08 DIAGNOSIS — J3489 Other specified disorders of nose and nasal sinuses: Secondary | ICD-10-CM | POA: Diagnosis not present

## 2015-12-08 MED ORDER — PREDNISONE 10 MG PO TABS: ORAL_TABLET | ORAL | 0 refills | Status: DC

## 2015-12-08 NOTE — Progress Notes (Signed)
Subjective:    Patient ID: Earnstine RegalAmy E Braud, female    DOB: 11/15/1975, 40 y.o.   MRN: [id number]  HPI  [name] is a 40 year old female who presents today with a chief complaint of sinus pressure. She also reports nasal congestion, frontal headaches, facial pain and tenderness, nonproductive cough, sore throat, hoarseness. Her symptoms began 6 days ago. She denies fevers, chest pain, SOB, dizziness, vision changes, nausea, vomiting. She has a significant history of seasonal allergies, which she receives allergy shots. She was delayed in receiving her allergy shot last week and received it this Monday.   Review of Systems  Constitutional: Positive for fatigue. Negative for chills and fever.  HENT: Positive for congestion, postnasal drip, sinus pressure and sore throat. Negative for ear pain.   Respiratory: Positive for cough. Negative for shortness of breath.   Gastrointestinal: Negative for nausea.       Past Medical History:  Diagnosis Date  . Hypertension      Social History   Social History  . Marital status: Single    Spouse name: N/A  . Number of children: 0  . Years of education: N/A   [place]  [employer]   Social History Main Topics  . Smoking status: Never Smoker  . Smokeless tobacco: Not on file  . Alcohol use Yes     Comment: occasional  . Drug use: No  . Sexual activity: Not on file   Other Topics Concern  . Not on file   Social History Narrative  . No narrative on file    Past Surgical History:  Procedure Laterality Date  . WISDOM TOOTH EXTRACTION     age 89    Family History  Problem Relation Age of Onset  . Diabetes Mother     pre diabetic  . Obesity Mother   . Diabetes Father   . Hypertension Father   . Hyperlipidemia Father   . ALS Father   . Diabetes Maternal Grandmother   . Diabetes Cousin   . Cancer Other     breast    No Known Allergies  Current Outpatient Prescriptions on File Prior to Visit    Medication Sig Dispense Refill  . albuterol (PROAIR HFA) 108 (90 BASE) MCG/ACT inhaler INSTILL 2 SPRAYS EVERY 4 HOURS AS NEEDED FOR WHEEZING 8.5 g 5  . fexofenadine (ALLEGRA) 180 MG tablet Take 180 mg by mouth daily.      . fluocinonide (LIDEX) 0.05 % external solution Apply 1 application topically 2 (two) times daily. 60 mL 0  . fluocinonide-emollient (LIDEX-E) 0.05 % cream Apply 1 application topically 2 (two) times daily. 30 g 0  . norethindrone-ethinyl estradiol (DASETTA 7/7/7) 0.5/0.75/1-35 MG-MCG tablet Take 1 tablet by mouth daily. 28 tablet 11  . ramipril (ALTACE) 5 MG capsule Take 1 capsule (5 mg total) by mouth daily. 30 capsule 11   No current facility-administered medications on file prior to visit.     BP 124/86   Pulse 82   Temp 98.2 F (36.8 C) (Oral)   Ht 5\' 3"  (1.6 m)   Wt 209 lb (94.8 kg)   LMP 12/06/2015   SpO2 99%   BMI 37.02 kg/m    Objective:   Physical Exam  Constitutional: She appears well-nourished. She does not appear ill.  HENT:  Right Ear: Tympanic membrane and ear canal normal.  Left Ear: Tympanic membrane and ear canal normal.  Nose: Mucosal edema present. Right sinus exhibits maxillary sinus  tenderness. Right sinus exhibits no frontal sinus tenderness. Left sinus exhibits maxillary sinus tenderness. Left sinus exhibits no frontal sinus tenderness.  Mouth/Throat: Oropharynx is clear and moist.  Eyes: Conjunctivae are normal.  Neck: Neck supple.  Cardiovascular: Normal rate and regular rhythm.   Pulmonary/Chest: Effort normal and breath sounds normal. She has no wheezes. She has no rales.  Lymphadenopathy:    She has no cervical adenopathy.  Skin: Skin is warm and dry.          Assessment & Plan:  Viral Sinusitis:  Nasal congestion, sinus pressure, fatigue x 6 days. Little improvement with Rx and OTC meds. Exam today without evidence of bacterial involvement. Lungs clear, dose not appear acutely ill. Suspect viral involvement and will  treat with conservative measures. Rx for low dose prednisone provided for sinus pressure. discussed to refrain from use of Dymista for now. Continue Singulair and Allegra. Start Neti Pot rinses. Return precautions provided.  Morrie Sheldon, NP

## 2015-12-08 NOTE — Patient Instructions (Signed)
Start prednisone tablets. Take three tablets for 2 days, then two tablets for 2 days, then one tablet for 2 days.  Refrain from use of your Dymista while taking Prednisone tablets.  Continue Allegra and Singulair.  Try Neti Pot rinses to help clear our your sinuses.  Please notify me if you develop persistent fevers of 101, start coughing up green mucous, you start feeling worse Monday next week.    Increase consumption of water intake and rest.  It was a pleasure meeting you!

## 2015-12-08 NOTE — Progress Notes (Signed)
Pre visit review using our clinic review tool, if applicable. No additional management support is needed unless otherwise documented below in the visit note. 

## 2015-12-09 NOTE — Addendum Note (Signed)
Addended by: Tawnya CrookSAMBATH, Braeley Buskey on: 12/09/2015 10:38 AM   Modules accepted: Orders

## 2015-12-13 ENCOUNTER — Telehealth: Payer: Self-pay

## 2015-12-13 DIAGNOSIS — J019 Acute sinusitis, unspecified: Secondary | ICD-10-CM

## 2015-12-13 MED ORDER — AMOXICILLIN-POT CLAVULANATE 875-125 MG PO TABS: 1.0000 | ORAL_TABLET | Freq: Two times a day (BID) | ORAL | 0 refills | Status: DC

## 2015-12-13 NOTE — Telephone Encounter (Signed)
Spoken and notified patient of Kate's comments. Patient verbalized understanding. 

## 2015-12-13 NOTE — Telephone Encounter (Signed)
Pt left v/m; pt seen 12/08/15; pt has taken prednisone and pt is not feeling any better; pt starting to have prod cough with green phlegm. Pt having to sit up to sleep so pt can breath.[facility]. Pt request different med. Please advise.

## 2015-12-13 NOTE — Telephone Encounter (Signed)
Please notify patient that I have sent in a prescription for Augmentin antibiotics for sinus infection. Take 1 tablet by mouth twice daily for 10 days.

## 2016-08-08 ENCOUNTER — Other Ambulatory Visit: Payer: Self-pay | Admitting: Family Medicine

## 2016-08-29 ENCOUNTER — Other Ambulatory Visit: Payer: Self-pay | Admitting: Family Medicine

## 2016-08-29 DIAGNOSIS — Z01419 Encounter for gynecological examination (general) (routine) without abnormal findings: Secondary | ICD-10-CM

## 2016-09-04 ENCOUNTER — Other Ambulatory Visit (INDEPENDENT_AMBULATORY_CARE_PROVIDER_SITE_OTHER): Payer: BC Managed Care – PPO

## 2016-09-04 ENCOUNTER — Other Ambulatory Visit: Payer: Self-pay | Admitting: Family Medicine

## 2016-09-04 DIAGNOSIS — Z01419 Encounter for gynecological examination (general) (routine) without abnormal findings: Secondary | ICD-10-CM | POA: Diagnosis not present

## 2016-09-04 LAB — COMPREHENSIVE METABOLIC PANEL
ALT: 24 U/L (ref 0–35)
AST: 21 U/L (ref 0–37)
Albumin: 3.7 g/dL (ref 3.5–5.2)
Alkaline Phosphatase: 67 U/L (ref 39–117)
BUN: 9 mg/dL (ref 6–23)
CALCIUM: 8.8 mg/dL (ref 8.4–10.5)
CHLORIDE: 106 meq/L (ref 96–112)
CO2: 24 meq/L (ref 19–32)
CREATININE: 0.71 mg/dL (ref 0.40–1.20)
GFR: 96.86 mL/min (ref >60.00)
Glucose, Bld: 97 mg/dL (ref 70–99)
Potassium: 4 mEq/L (ref 3.5–5.1)
Sodium: 137 mEq/L (ref 135–145)
Total Bilirubin: 0.3 mg/dL (ref 0.2–1.2)
Total Protein: 6.4 g/dL (ref 6.0–8.3)

## 2016-09-04 LAB — LIPID PANEL
CHOL/HDL RATIO: 5
Cholesterol: 163 mg/dL (ref 0–200)
HDL: 33.6 mg/dL — AB (ref >39.00)
LDL CALC: 96 mg/dL (ref 0–99)
NonHDL: 129.1
TRIGLYCERIDES: 165 mg/dL — AB (ref 0.0–149.0)
VLDL: 33 mg/dL (ref 0.0–40.0)

## 2016-09-04 LAB — CBC WITH DIFFERENTIAL/PLATELET
BASOS PCT: 0.6 % (ref 0.0–3.0)
Basophils Absolute: 0.1 10*3/uL (ref 0.0–0.1)
EOS ABS: 0.2 10*3/uL (ref 0.0–0.7)
Eosinophils Relative: 2 % (ref 0.0–5.0)
HEMATOCRIT: 41 % (ref 36.0–46.0)
Hemoglobin: 13.8 g/dL (ref 12.0–15.0)
LYMPHS ABS: 3.4 10*3/uL (ref 0.7–4.0)
LYMPHS PCT: 28 % (ref 12.0–46.0)
MCHC: 33.6 g/dL (ref 30.0–36.0)
MCV: 84.3 fl (ref 78.0–100.0)
Monocytes Absolute: 0.4 10*3/uL (ref 0.1–1.0)
Monocytes Relative: 3.6 % (ref 3.0–12.0)
NEUTROS ABS: 7.9 10*3/uL — AB (ref 1.4–7.7)
Neutrophils Relative %: 65.8 % (ref 43.0–77.0)
PLATELETS: 328 10*3/uL (ref 150.0–400.0)
RBC: 4.87 Mil/uL (ref 3.87–5.11)
RDW: 13.6 % (ref 11.5–15.5)
WBC: 12 10*3/uL — ABNORMAL HIGH (ref 4.0–10.5)

## 2016-09-04 LAB — TSH: TSH: 1.59 u[IU]/mL (ref 0.35–4.50)

## 2016-09-11 ENCOUNTER — Ambulatory Visit (INDEPENDENT_AMBULATORY_CARE_PROVIDER_SITE_OTHER): Payer: BC Managed Care – PPO | Admitting: Family Medicine

## 2016-09-11 ENCOUNTER — Other Ambulatory Visit (HOSPITAL_COMMUNITY)
Admission: RE | Admit: 2016-09-11 | Discharge: 2016-09-11 | Disposition: A | Discharge status: Home / Self Care | Payer: BC Managed Care – PPO | Source: Ambulatory Visit | Attending: Family Medicine | Admitting: Family Medicine

## 2016-09-11 VITALS — BP 150/98 | HR 78 | Ht 63.25 in | Wt 206.0 lb

## 2016-09-11 DIAGNOSIS — Z3041 Encounter for surveillance of contraceptive pills: Secondary | ICD-10-CM | POA: Diagnosis not present

## 2016-09-11 DIAGNOSIS — I10 Essential (primary) hypertension: Secondary | ICD-10-CM

## 2016-09-11 DIAGNOSIS — Z1239 Encounter for other screening for malignant neoplasm of breast: Secondary | ICD-10-CM

## 2016-09-11 DIAGNOSIS — R87810 Cervical high risk human papillomavirus (HPV) DNA test positive: Secondary | ICD-10-CM | POA: Insufficient documentation

## 2016-09-11 DIAGNOSIS — Z01411 Encounter for gynecological examination (general) (routine) with abnormal findings: Secondary | ICD-10-CM

## 2016-09-11 DIAGNOSIS — Z124 Encounter for screening for malignant neoplasm of cervix: Secondary | ICD-10-CM

## 2016-09-11 DIAGNOSIS — G40909 Epilepsy, unspecified, not intractable, without status epilepticus: Secondary | ICD-10-CM

## 2016-09-11 DIAGNOSIS — Z01419 Encounter for gynecological examination (general) (routine) without abnormal findings: Secondary | ICD-10-CM

## 2016-09-11 DIAGNOSIS — Z309 Encounter for contraceptive management, unspecified: Secondary | ICD-10-CM | POA: Insufficient documentation

## 2016-09-11 HISTORY — DX: Cervical high risk human papillomavirus (HPV) DNA test positive: R87.810

## 2016-09-11 MED ORDER — NORETHIN-ETH ESTRAD TRIPHASIC 0.5/0.75/1-35 MG-MCG PO TABS: 1.0000 | ORAL_TABLET | Freq: Every day | ORAL | 11 refills | Status: DC

## 2016-09-11 MED ORDER — RAMIPRIL 10 MG PO CAPS: 10.0000 mg | ORAL_CAPSULE | Freq: Every day | ORAL | 3 refills | Status: DC

## 2016-09-11 NOTE — Progress Notes (Addendum)
41 yo pleasant female with h/o asthma here for CPX and follow up of chronic medical conditions.  Well woman- GO, no h/o abnormal pap smears.  Last pap smear was in 09/07/15-done by me.  Neg cytology but pos for HPV. Sexually active with one partner only.  No family h/o cevical, breast or uterine cancer.  Has never had a mammogram.  Denies any dysuria, vaginal discharge or other symptoms. Pleased with current OCPs.   Asthma- Allergies shots have helped tremendously.     Now using Dymista as needed for Allergic rhinitis and has been receiving allergy shots which has been helping significantly.  HTN- has not been well controlled on current dose of Altace.  Multiple elevated readings at home at other doctor's offices. Lab Results  Component Value Date   CREATININE 0.71 09/04/2016   Lab Results  Component Value Date   CHOL 163 09/04/2016   HDL 33.60 (L) 09/04/2016   LDLCALC 96 09/04/2016   LDLDIRECT 129.0 08/16/2015   TRIG 165.0 (H) 09/04/2016   CHOLHDL 5 09/04/2016   Lab Results  Component Value Date   TSH 1.59 09/04/2016   Lab Results  Component Value Date   WBC 12.0 (H) 09/04/2016   HGB 13.8 09/04/2016   HCT 41.0 09/04/2016   MCV 84.3 09/04/2016   PLT 328.0 09/04/2016       Patient Active Problem List   Diagnosis Date Noted  . Cervical high risk human papillomavirus (HPV) DNA test positive 09/11/2016  . Well woman exam 09/07/2015  . Encounter for routine gynecological examination 09/07/2015  . Seizure disorder (HCC) 03/25/2007  . Essential hypertension 03/20/2007  . ALLERGIC RHINITIS, SEASONAL 03/20/2007  . Asthma 03/20/2007   Past Medical History:  Diagnosis Date  . Hypertension    Past Surgical History:  Procedure Laterality Date  . WISDOM TOOTH EXTRACTION     age 41   Social History  Substance Use Topics  . Smoking status: Never Smoker  . Smokeless tobacco: Not on file  . Alcohol use Yes     Comment: occasional   Family History  Problem  Relation Age of Onset  . Diabetes Mother        pre diabetic  . Obesity Mother   . Diabetes Father   . Hypertension Father   . Hyperlipidemia Father   . ALS Father   . Diabetes Maternal Grandmother   . Diabetes Cousin   . Cancer Other        breast   No Known Allergies Current Outpatient Prescriptions on File Prior to Visit  Medication Sig Dispense Refill  . albuterol (PROAIR HFA) 108 (90 BASE) MCG/ACT inhaler INSTILL 2 SPRAYS EVERY 4 HOURS AS NEEDED FOR WHEEZING 8.5 g 5  . DYMISTA 137-50 MCG/ACT SUSP Inhale 1 puff into the lungs 2 (two) times daily as needed.     . fexofenadine (ALLEGRA) 180 MG tablet Take 180 mg by mouth daily.      . fluocinonide (LIDEX) 0.05 % external solution Apply 1 application topically 2 (two) times daily. 60 mL 0  . fluocinonide-emollient (LIDEX-E) 0.05 % cream Apply 1 application topically 2 (two) times daily. 30 g 0  . montelukast (SINGULAIR) 10 MG tablet     . ramipril (ALTACE) 5 MG capsule take 1 capsule by mouth once daily 30 capsule 11   No current facility-administered medications on file prior to visit.     The PMH, PSH, Social History, Family History, Medications, and allergies [facility], and have  been updated if relevant.    Review of Systems  Constitutional: Negative.   Eyes: Negative.   Respiratory: Negative.   Cardiovascular: Negative.   Gastrointestinal: Negative.   Endocrine: Negative.   Genitourinary: Negative.   Musculoskeletal: Negative.   Skin: Negative.   Allergic/Immunologic: Negative.   Neurological: Negative for dizziness, seizures, facial asymmetry, speech difficulty, light-headedness, numbness and headaches.  Hematological: Negative.   Psychiatric/Behavioral: Negative.       Physical Exam BP (!) 150/98   Pulse 78   Ht 5' 3.25" (1.607 m)   Wt 206 lb (93.4 kg)   LMP 09/09/2016   SpO2 98%   BMI 36.20 kg/m  Wt Readings from Last 3 Encounters:  09/11/16 206 lb (93.4 kg)  12/08/15 209 lb (94.8  kg)  09/07/15 207 lb (93.9 kg)    General:  Well-developed,well-nourished,in no acute distress; alert,appropriate and cooperative throughout examination Head:  normocephalic and atraumatic.   Eyes:  vision grossly intact, pupils equal, pupils round, and pupils reactive to light.   Ears:  R ear normal and L ear normal.   Nose:  no external deformity.   Mouth:  good dentition.   Neck:  No deformities, masses, or tenderness noted. Breasts:  No mass, nodules, thickening, tenderness, bulging, retraction, inflamation, nipple discharge or skin changes noted.   Lungs:  Normal respiratory effort, chest expands symmetrically. Lungs are clear to auscultation, no crackles or wheezes. Heart:  Normal rate and regular rhythm. S1 and S2 normal without gallop, murmur, click, rub or other extra sounds. Abdomen:  Bowel sounds positive,abdomen soft and non-tender without masses, organomegaly or hernias noted. Rectal:  no external abnormalities.   Genitalia:  Pelvic Exam:        External: normal female genitalia without lesions or masses        Vagina: normal without lesions or masses        Cervix: normal without lesions or masses        Adnexa: normal bimanual exam without masses or fullness        Uterus: normal by palpation        Pap smear: performed Msk:  No deformity or scoliosis noted of thoracic or lumbar spine.   Extremities:  No clubbing, cyanosis, edema, or deformity noted with normal full range of motion of all joints.   Neurologic:  alert & oriented X3 and gait normal.   Skin:  Intact without suspicious lesions or rashes Cervical Nodes:  No lymphadenopathy noted Axillary Nodes:  No palpable lymphadenopathy Psych:  Cognition and judgment appear intact. Alert and cooperative with normal attention span and concentration. No apparent delusions, illusions, hallucinations

## 2016-09-11 NOTE — Addendum Note (Signed)
Addended by: Gregery NaVALENCIA, Dara Camargo P on: 09/11/2016 08:37 AM   Modules accepted: Orders

## 2016-09-11 NOTE — Progress Notes (Signed)
Pre visit review using our clinic review tool, if applicable. No additional management support is needed unless otherwise documented below in the visit note. 

## 2016-09-11 NOTE — Assessment & Plan Note (Signed)
Pap smear today. 

## 2016-09-11 NOTE — Assessment & Plan Note (Signed)
Pleased with current OCPs. eRx refills sent. 

## 2016-09-11 NOTE — Assessment & Plan Note (Signed)
Deteriorated. Increase Altace to 10 mg daily. She will keep an eye on her blood pressure at home and keep me updated.

## 2016-09-11 NOTE — Patient Instructions (Addendum)
Great to see you.  Please call to schedule your mammogram.  We are increasing your Altace 10 mg daily. Please check you BP daily and call me or email me in 2 weeks.

## 2016-09-11 NOTE — Assessment & Plan Note (Addendum)
Reviewed preventive care protocols, scheduled due services, and updated immunizations Discussed nutrition, exercise, diet, and healthy lifestyle.  Mammogram ordered. Pt to schedule. 

## 2016-09-12 LAB — CYTOLOGY - PAP
Diagnosis: NEGATIVE
HPV: NOT DETECTED

## 2016-10-04 ENCOUNTER — Ambulatory Visit
Admission: RE | Admit: 2016-10-04 | Discharge: 2016-10-04 | Disposition: A | Discharge status: Home / Self Care | Payer: BC Managed Care – PPO | Source: Ambulatory Visit | Attending: Family Medicine | Admitting: Family Medicine

## 2016-10-04 DIAGNOSIS — Z1239 Encounter for other screening for malignant neoplasm of breast: Secondary | ICD-10-CM

## 2016-10-04 DIAGNOSIS — Z1231 Encounter for screening mammogram for malignant neoplasm of breast: Secondary | ICD-10-CM | POA: Insufficient documentation

## 2017-04-10 ENCOUNTER — Encounter: Payer: Self-pay | Admitting: Primary Care

## 2017-04-10 ENCOUNTER — Ambulatory Visit: Payer: BC Managed Care – PPO | Admitting: Primary Care

## 2017-04-10 VITALS — BP 120/80 | HR 84 | Temp 98.1°F | Ht 63.25 in | Wt 208.8 lb

## 2017-04-10 DIAGNOSIS — J019 Acute sinusitis, unspecified: Secondary | ICD-10-CM

## 2017-04-10 DIAGNOSIS — B9689 Other specified bacterial agents as the cause of diseases classified elsewhere: Secondary | ICD-10-CM

## 2017-04-10 MED ORDER — BENZONATATE 200 MG PO CAPS: 200.0000 mg | ORAL_CAPSULE | Freq: Three times a day (TID) | ORAL | 0 refills | Status: DC | PRN

## 2017-04-10 MED ORDER — AMOXICILLIN-POT CLAVULANATE 875-125 MG PO TABS: 1.0000 | ORAL_TABLET | Freq: Two times a day (BID) | ORAL | 0 refills | Status: DC

## 2017-04-10 NOTE — Progress Notes (Signed)
Subjective:    Patient ID: Earnstine RegalAmy E Poehlman, female    DOB: 12/22/1975, 42 y.o.   MRN: [id number]  HPI  [name] is a 42 year old female with a history of allergic rhinitis, asthma, hypertension who presents today with a chief complaint of sinus pressure.  She also reports cough, chest congestion, body aches, sore throat. Her symptoms began 2-3 weeks ago. She's not getting much mucous from her nasal cavity. She's been taking Allegra, Singulair, Claritin, Coricidin without improvement. Overall she's feeling worse and symptoms aren't improving. She denies fevers.   Review of Systems  Constitutional: Positive for fatigue. Negative for fever.  HENT: Positive for congestion, sinus pressure and sinus pain.   Respiratory: Positive for cough. Negative for shortness of breath and wheezing.        Past Medical History:  Diagnosis Date  . Hypertension      Social History   Socioeconomic History  . Marital status: Single    Spouse name: Not on file  . Number of children: 0  . Years of education: Not on file  . Highest education level: Not on file  Social Needs  . Financial resource strain: Not on file  . Food insecurity - worry: Not on file  . Food insecurity - inability: Not on file  . Transportation needs - medical: Not on file  . Transportation needs - non-medical: Not on file  Occupational History    Employer: [employer]  Tobacco Use  . Smoking status: Never Smoker  . Smokeless tobacco: Never Used  Substance and Sexual Activity  . Alcohol use: Yes    Comment: occasional  . Drug use: No  . Sexual activity: Not on file  Other Topics Concern  . Not on file  Social History Narrative  . Not on file    Past Surgical History:  Procedure Laterality Date  . WISDOM TOOTH EXTRACTION     age 42    Family History  Problem Relation Age of Onset  . Diabetes Mother        pre diabetic  . Obesity Mother   . Diabetes Father   . Hypertension Father   . Hyperlipidemia  Father   . ALS Father   . Cancer Other        breast  . Diabetes Maternal Grandmother   . Diabetes Cousin     No Known Allergies  Current Outpatient Medications on File Prior to Visit  Medication Sig Dispense Refill  . albuterol (PROAIR HFA) 108 (90 BASE) MCG/ACT inhaler INSTILL 2 SPRAYS EVERY 4 HOURS AS NEEDED FOR WHEEZING 8.5 g 5  . DYMISTA 137-50 MCG/ACT SUSP Inhale 1 puff into the lungs 2 (two) times daily as needed.     . fexofenadine (ALLEGRA) 180 MG tablet Take 180 mg by mouth daily.      . montelukast (SINGULAIR) 10 MG tablet     . norethindrone-ethinyl estradiol (NECON 7/7/7) 0.5/0.75/1-35 MG-MCG tablet Take 1 tablet by mouth daily. 28 tablet 11  . ramipril (ALTACE) 10 MG capsule Take 1 capsule (10 mg total) by mouth daily. 90 capsule 3   No current facility-administered medications on file prior to visit.     BP 120/80   Pulse 84   Temp 98.1 F (36.7 C) (Oral)   Ht 5' 3.25" (1.607 m)   Wt 208 lb 12.8 oz (94.7 kg)   LMP 04/01/2017   SpO2 98%   BMI 36.70 kg/m    Objective:   Physical Exam  Constitutional: She appears well-nourished. She appears ill.  HENT:  Right Ear: Tympanic membrane and ear canal normal.  Left Ear: Tympanic membrane and ear canal normal.  Nose: Mucosal edema present. Right sinus exhibits maxillary sinus tenderness and frontal sinus tenderness. Left sinus exhibits maxillary sinus tenderness and frontal sinus tenderness.  Mouth/Throat: Oropharynx is clear and moist.  Eyes: Conjunctivae are normal.  Neck: Neck supple.  Cardiovascular: Normal rate and regular rhythm.  Pulmonary/Chest: Effort normal and breath sounds normal. She has no wheezes. She has no rales.  Lymphadenopathy:    She has no cervical adenopathy.  Skin: Skin is warm and dry.          Assessment & Plan:  Acute Sinusitis:  Sinus pressure, fatigue, congestion, cough x 2 weeks. Exam today consistent for likely bacterial involvement, especially given duration of symptoms  and presentation. Rx for Augmentin course and Tessalon Perles sent to pharmacy. Fluids, rest, follow up PRN.  Doreene Nest, NP

## 2017-04-10 NOTE — Patient Instructions (Signed)
Start Augmentin antibiotics for the infection Take 1 tablet by mouth twice daily for 10 days.  You may take Benzonatate capsules for cough. Take 1 capsule by mouth three times daily as needed for cough.  Ensure you are staying hydrated with water and rest.  It was a pleasure meeting you!

## 2017-05-01 ENCOUNTER — Ambulatory Visit: Payer: BC Managed Care – PPO | Admitting: Primary Care

## 2017-05-01 ENCOUNTER — Encounter: Payer: Self-pay | Admitting: Primary Care

## 2017-05-01 VITALS — BP 122/82 | HR 90 | Temp 98.0°F | Ht 63.5 in | Wt 210.5 lb

## 2017-05-01 DIAGNOSIS — I10 Essential (primary) hypertension: Secondary | ICD-10-CM

## 2017-05-01 DIAGNOSIS — F419 Anxiety disorder, unspecified: Secondary | ICD-10-CM | POA: Insufficient documentation

## 2017-05-01 DIAGNOSIS — J4520 Mild intermittent asthma, uncomplicated: Secondary | ICD-10-CM

## 2017-05-01 DIAGNOSIS — G40909 Epilepsy, unspecified, not intractable, without status epilepticus: Secondary | ICD-10-CM | POA: Diagnosis not present

## 2017-05-01 DIAGNOSIS — J301 Allergic rhinitis due to pollen: Secondary | ICD-10-CM

## 2017-05-01 NOTE — Assessment & Plan Note (Signed)
Seems very situational and secondary to this difficult student. GAD 7 score of 15 today, denies depression. Recommended we start with therapy, she agrees. Referral placed, she'll update if symptoms progress despite therapy.

## 2017-05-01 NOTE — Assessment & Plan Note (Signed)
Stable on current regimen, continue Singulair, Allegra, Dymista.

## 2017-05-01 NOTE — Patient Instructions (Addendum)
Stop by the front desk and speak with either Shirlee LimerickMarion or Zella Ballobin regarding your referral to therapy with Synetta Failnita.  Please schedule a physical with me this Summer. You may also schedule a lab only appointment 3-4 days prior. We will discuss your lab results in detail during your physical.  It was a pleasure to see you today!

## 2017-05-01 NOTE — Assessment & Plan Note (Signed)
No seizures since 5th grade.

## 2017-05-01 NOTE — Assessment & Plan Note (Signed)
Stable in the office today, continue rampril 10 mg. BMP due in July 2019.
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
## 2017-05-01 NOTE — Assessment & Plan Note (Signed)
Stable on current regimen, using albuterol inhaler infrequently. Continue Singular and Allegra.

## 2017-05-28 ENCOUNTER — Ambulatory Visit: Payer: BC Managed Care – PPO | Admitting: Psychology

## 2017-05-28 DIAGNOSIS — F410 Panic disorder [episodic paroxysmal anxiety] without agoraphobia: Secondary | ICD-10-CM

## 2017-06-07 ENCOUNTER — Other Ambulatory Visit: Payer: Self-pay | Admitting: Family Medicine

## 2017-06-07 DIAGNOSIS — I10 Essential (primary) hypertension: Secondary | ICD-10-CM

## 2017-06-08 NOTE — Telephone Encounter (Signed)
Shari Armstrong, patient due for CPE in July/August 2019, will you please schedule?

## 2017-06-11 NOTE — Telephone Encounter (Signed)
Attempted to reach pt. Left vm asking her to call back.

## 2017-06-13 ENCOUNTER — Ambulatory Visit: Payer: BC Managed Care – PPO | Admitting: Psychology

## 2017-06-20 ENCOUNTER — Ambulatory Visit: Payer: BC Managed Care – PPO | Admitting: Psychology

## 2017-06-20 DIAGNOSIS — F410 Panic disorder [episodic paroxysmal anxiety] without agoraphobia: Secondary | ICD-10-CM

## 2017-07-10 ENCOUNTER — Ambulatory Visit: Payer: BC Managed Care – PPO | Admitting: Psychology

## 2017-08-21 ENCOUNTER — Encounter: Payer: Self-pay | Admitting: Primary Care

## 2017-09-06 ENCOUNTER — Other Ambulatory Visit: Payer: Self-pay | Admitting: Family Medicine

## 2017-09-06 DIAGNOSIS — Z3041 Encounter for surveillance of contraceptive pills: Secondary | ICD-10-CM

## 2017-09-06 NOTE — Telephone Encounter (Signed)
Refill sent to pharmacy.   

## 2017-09-06 NOTE — Telephone Encounter (Signed)
Please see refill request. Thank you- TLG

## 2017-09-08 ENCOUNTER — Other Ambulatory Visit: Payer: Self-pay | Admitting: Primary Care

## 2017-09-08 DIAGNOSIS — I10 Essential (primary) hypertension: Secondary | ICD-10-CM

## 2017-10-09 ENCOUNTER — Encounter: Payer: Self-pay | Admitting: Primary Care

## 2017-10-09 ENCOUNTER — Ambulatory Visit (INDEPENDENT_AMBULATORY_CARE_PROVIDER_SITE_OTHER): Payer: BC Managed Care – PPO | Admitting: Primary Care

## 2017-10-09 VITALS — BP 128/84 | HR 85 | Temp 97.7°F | Ht 63.5 in | Wt 210.2 lb

## 2017-10-09 DIAGNOSIS — Z1239 Encounter for other screening for malignant neoplasm of breast: Secondary | ICD-10-CM

## 2017-10-09 DIAGNOSIS — Z0000 Encounter for general adult medical examination without abnormal findings: Secondary | ICD-10-CM | POA: Diagnosis not present

## 2017-10-09 DIAGNOSIS — Z1231 Encounter for screening mammogram for malignant neoplasm of breast: Secondary | ICD-10-CM

## 2017-10-09 DIAGNOSIS — J301 Allergic rhinitis due to pollen: Secondary | ICD-10-CM

## 2017-10-09 DIAGNOSIS — Z3041 Encounter for surveillance of contraceptive pills: Secondary | ICD-10-CM | POA: Diagnosis not present

## 2017-10-09 DIAGNOSIS — L301 Dyshidrosis [pompholyx]: Secondary | ICD-10-CM

## 2017-10-09 DIAGNOSIS — I10 Essential (primary) hypertension: Secondary | ICD-10-CM | POA: Diagnosis not present

## 2017-10-09 DIAGNOSIS — J4520 Mild intermittent asthma, uncomplicated: Secondary | ICD-10-CM

## 2017-10-09 MED ORDER — TRIAMCINOLONE ACETONIDE 0.1 % EX CREA: 1.0000 "application " | TOPICAL_CREAM | Freq: Two times a day (BID) | CUTANEOUS | 0 refills | Status: DC

## 2017-10-09 MED ORDER — NORETHIN-ETH ESTRAD TRIPHASIC 0.5/0.75/1-35 MG-MCG PO TABS: 1.0000 | ORAL_TABLET | Freq: Every day | ORAL | 3 refills | Status: DC

## 2017-10-09 MED ORDER — ALBUTEROL SULFATE HFA 108 (90 BASE) MCG/ACT IN AERS: INHALATION_SPRAY | RESPIRATORY_TRACT | 0 refills | Status: AC

## 2017-10-09 MED ORDER — DYMISTA 137-50 MCG/ACT NA SUSP: 1.0000 | Freq: Two times a day (BID) | NASAL | 0 refills | Status: DC | PRN

## 2017-10-09 MED ORDER — RAMIPRIL 10 MG PO CAPS: ORAL_CAPSULE | ORAL | 3 refills | Status: DC

## 2017-10-09 NOTE — Assessment & Plan Note (Signed)
Immunizations UTD. Pap smear UTD. Mammogram due, orders placed. Recommended to work on diet, increase exercise. Exam unremarkable.  Labs pending. Follow up in 1 year for CPE.

## 2017-10-09 NOTE — Assessment & Plan Note (Signed)
Doing well on OCP's has noticed mild hot flashes.  Pap smear UTD.  Refills sent to pharmacy.

## 2017-10-09 NOTE — Assessment & Plan Note (Signed)
Needs new inhaler as her old has expired. Uses infrequently. Refill sent to pharmacy.

## 2017-10-09 NOTE — Assessment & Plan Note (Signed)
Chronic. Noted to right palmer hand. Rx for Triamcinolone sent to pharmacy to use PRN. She will update.

## 2017-10-09 NOTE — Progress Notes (Signed)
Subjective:    Patient ID: Shari Armstrong, female    DOB: 01/15/1976, [age] y.o.   MRN: [id number]  HPI  Ms. Shari Armstrong is a 42 year old female who presents today for complete physical.  Long history of intermittent rash to hands (eczema) which is more noticeable during increased stress. Mostly treats with Eucerin ointment with improvement but no improvement recently. She's never tried Rx treatment.   BP Readings from Last 3 Encounters:  10/09/17 128/84  05/01/17 122/82  04/10/17 120/80     Immunizations: -Tetanus: Completed in 2015 -Influenza: Completed last season, due.   Diet: She endorses a healthy diet. Breakfast: Toast with peanut butter and jelly Lunch: Sandwich, chips Dinner: Restaurants, sometimes fast food Snacks: Baked chips, nuts Desserts: Ice cream, cookies 2-3 days weekly Beverages: Hot tea, water, diet soda  Exercise: She is walking several days weekly at school Eye exam: Completed in July 2019 Dental exam: Completes semi-annually  Pap Smear: Completed in 2018 Mammogram: Completed in August 2018    Review of Systems  Constitutional: Negative for unexpected weight change.  HENT: Negative for rhinorrhea.   Respiratory: Negative for cough and shortness of breath.   Cardiovascular: Negative for chest pain.  Gastrointestinal: Negative for constipation and diarrhea.  Genitourinary: Negative for difficulty urinating and menstrual problem.       Intermittent hot flashes  Musculoskeletal: Negative for arthralgias and myalgias.  Skin: Positive for rash.  Allergic/Immunologic: Positive for environmental allergies.  Neurological: Negative for dizziness, numbness and headaches.  Psychiatric/Behavioral: The patient is not nervous/anxious.        Past Medical History:  Diagnosis Date  . Hypertension      Social History   Socioeconomic History  . Marital status: Single    Spouse name: Not on file  . Number of children: 0  . Years of education: Not on file    . Highest education level: Not on file  Occupational History    Employer: [employer]  Social Needs  . Financial resource strain: Not on file  . Food insecurity:    Worry: Not on file    Inability: Not on file  . Transportation needs:    Medical: Not on file    Non-medical: Not on file  Tobacco Use  . Smoking status: Never Smoker  . Smokeless tobacco: Never Used  Substance and Sexual Activity  . Alcohol use: Yes    Comment: occasional  . Drug use: No  . Sexual activity: Not on file  Lifestyle  . Physical activity:    Days per week: Not on file    Minutes per session: Not on file  . Stress: Not on file  Relationships  . Social connections:    Talks on phone: Not on file    Gets together: Not on file    Attends religious service: Not on file    Active member of club or organization: Not on file    Attends meetings of clubs or organizations: Not on file    Relationship status: Not on file  . Intimate partner violence:    Fear of current or ex partner: Not on file    Emotionally abused: Not on file    Physically abused: Not on file    Forced sexual activity: Not on file  Other Topics Concern  . Not on file  Social History Narrative  . Not on file    Past Surgical History:  Procedure Laterality Date  . WISDOM TOOTH EXTRACTION  age 42    Family History  Problem Relation Age of Onset  . Diabetes Mother        pre diabetic  . Obesity Mother   . Diabetes Father   . Hypertension Father   . Hyperlipidemia Father   . ALS Father   . Cancer Other        breast  . Diabetes Maternal Grandmother   . Diabetes Cousin     No Known Allergies  Current Outpatient Medications on File Prior to Visit  Medication Sig Dispense Refill  . fexofenadine (ALLEGRA) 180 MG tablet Take 180 mg by mouth daily.      . montelukast (SINGULAIR) 10 MG tablet      No current facility-administered medications on file prior to visit.     BP 128/84   Pulse 85   Temp 97.7 F  (36.5 C) (Oral)   Ht 5' 3.5" (1.613 m)   Wt 210 lb 4 oz (95.4 kg)   LMP 10/05/2017   SpO2 98%   BMI 36.66 kg/m    Objective:   Physical Exam  Constitutional: She is oriented to person, place, and time. She appears well-nourished.  HENT:  Mouth/Throat: No oropharyngeal exudate.  Eyes: Pupils are equal, round, and reactive to light. EOM are normal.  Neck: Neck supple. No thyromegaly present.  Cardiovascular: Normal rate and regular rhythm.  Respiratory: Effort normal and breath sounds normal.  GI: Soft. Bowel sounds are normal. There is no tenderness.  Musculoskeletal: Normal range of motion.  Neurological: She is alert and oriented to person, place, and time.  Skin: Skin is warm and dry.  Dried mildly scaly rash to right palmer hand including 4th and 5th digits.   Psychiatric: She has a normal mood and affect.           Assessment & Plan:

## 2017-10-09 NOTE — Assessment & Plan Note (Signed)
Stable in the office today. BMP pending. Refills sent to pharmacy.

## 2017-10-09 NOTE — Assessment & Plan Note (Signed)
Using Dymista PRN. Using Singulair and Allegra. Continue same. Feels well managed.

## 2017-10-09 NOTE — Patient Instructions (Signed)
Stop by the lab prior to leaving today. I will notify you of your results once received.   Apply the triamcinolone cream twice daily to the skin for one week, then use as needed.  Continue exercising. You should be getting 150 minutes of moderate intensity exercise weekly.  It's important to improve your diet by reducing consumption of fast food, fried food, processed snack foods, sugary drinks. Increase consumption of fresh vegetables and fruits, whole grains, water.  Ensure you are drinking 64 ounces of water daily.  Follow up in 1 year for your annual exam or sooner if needed.  It was a pleasure to see you today!   Preventive Care 40-64 Years, Female Preventive care refers to lifestyle choices and visits with your health care provider that can promote health and wellness. What does preventive care include?  A yearly physical exam. This is also called an annual well check.  Dental exams once or twice a year.  Routine eye exams. Ask your health care provider how often you should have your eyes checked.  Personal lifestyle choices, including: ? Daily care of your teeth and gums. ? Regular physical activity. ? Eating a healthy diet. ? Avoiding tobacco and drug use. ? Limiting alcohol use. ? Practicing safe sex. ? Taking low-dose aspirin daily starting at age 45. ? Taking vitamin and mineral supplements as recommended by your health care provider. What happens during an annual well check? The services and screenings done by your health care provider during your annual well check will depend on your age, overall health, lifestyle risk factors, and family history of disease. Counseling Your health care provider may ask you questions about your:  Alcohol use.  Tobacco use.  Drug use.  Emotional well-being.  Home and relationship well-being.  Sexual activity.  Eating habits.  Work and work Statistician.  Method of birth control.  Menstrual cycle.  Pregnancy  history.  Screening You may have the following tests or measurements:  Height, weight, and BMI.  Blood pressure.  Lipid and cholesterol levels. These may be checked every 5 years, or more frequently if you are over 51 years old.  Skin check.  Lung cancer screening. You may have this screening every year starting at age 14 if you have a 30-pack-year history of smoking and currently smoke or have quit within the past 15 years.  Fecal occult blood test (FOBT) of the stool. You may have this test every year starting at age 12.  Flexible sigmoidoscopy or colonoscopy. You may have a sigmoidoscopy every 5 years or a colonoscopy every 10 years starting at age 35.  Hepatitis C blood test.  Hepatitis B blood test.  Sexually transmitted disease (STD) testing.  Diabetes screening. This is done by checking your blood sugar (glucose) after you have not eaten for a while (fasting). You may have this done every 1-3 years.  Mammogram. This may be done every 1-2 years. Talk to your health care provider about when you should start having regular mammograms. This may depend on whether you have a family history of breast cancer.  BRCA-related cancer screening. This may be done if you have a family history of breast, ovarian, tubal, or peritoneal cancers.  Pelvic exam and Pap test. This may be done every 3 years starting at age 70. Starting at age 33, this may be done every 5 years if you have a Pap test in combination with an HPV test.  Bone density scan. This is done to screen for osteoporosis.  You may have this scan if you are at high risk for osteoporosis.  Discuss your test results, treatment options, and if necessary, the need for more tests with your health care provider. Vaccines Your health care provider may recommend certain vaccines, such as:  Influenza vaccine. This is recommended every year.  Tetanus, diphtheria, and acellular pertussis (Tdap, Td) vaccine. You may need a Td booster  every 10 years.  Varicella vaccine. You may need this if you have not been vaccinated.  Zoster vaccine. You may need this after age 36.  Measles, mumps, and rubella (MMR) vaccine. You may need at least one dose of MMR if you were born in 1957 or later. You may also need a second dose.  Pneumococcal 13-valent conjugate (PCV13) vaccine. You may need this if you have certain conditions and were not previously vaccinated.  Pneumococcal polysaccharide (PPSV23) vaccine. You may need one or two doses if you smoke cigarettes or if you have certain conditions.  Meningococcal vaccine. You may need this if you have certain conditions.  Hepatitis A vaccine. You may need this if you have certain conditions or if you travel or work in places where you may be exposed to hepatitis A.  Hepatitis B vaccine. You may need this if you have certain conditions or if you travel or work in places where you may be exposed to hepatitis B.  Haemophilus influenzae type b (Hib) vaccine. You may need this if you have certain conditions.  Talk to your health care provider about which screenings and vaccines you need and how often you need them. This information is not intended to replace advice given to you by your health care provider. Make sure you discuss any questions you have with your health care provider. Document Released: 03/12/2015 Document Revised: 11/03/2015 Document Reviewed: 12/15/2014 Elsevier Interactive Patient Education  [employer].

## 2017-10-10 ENCOUNTER — Other Ambulatory Visit: Payer: Self-pay | Admitting: Primary Care

## 2017-10-10 DIAGNOSIS — R945 Abnormal results of liver function studies: Principal | ICD-10-CM

## 2017-10-10 DIAGNOSIS — R7989 Other specified abnormal findings of blood chemistry: Secondary | ICD-10-CM

## 2017-10-10 LAB — COMPREHENSIVE METABOLIC PANEL
ALT: 54 U/L — ABNORMAL HIGH (ref 0–35)
AST: 33 U/L (ref 0–37)
Albumin: 4.3 g/dL (ref 3.5–5.2)
Alkaline Phosphatase: 72 U/L (ref 39–117)
BUN: 10 mg/dL (ref 6–23)
CO2: 27 meq/L (ref 19–32)
Calcium: 9.8 mg/dL (ref 8.4–10.5)
Chloride: 103 mEq/L (ref 96–112)
Creatinine, Ser: 0.69 mg/dL (ref 0.40–1.20)
GFR: 99.56 mL/min (ref >60.00)
GLUCOSE: 79 mg/dL (ref 70–99)
POTASSIUM: 4.5 meq/L (ref 3.5–5.1)
Sodium: 138 mEq/L (ref 135–145)
Total Bilirubin: 0.3 mg/dL (ref 0.2–1.2)
Total Protein: 7.4 g/dL (ref 6.0–8.3)

## 2017-10-10 LAB — LIPID PANEL
CHOL/HDL RATIO: 5
Cholesterol: 159 mg/dL (ref 0–200)
HDL: 33.2 mg/dL — AB (ref >39.00)
LDL Cholesterol: 91 mg/dL (ref 0–99)
NONHDL: 125.47
Triglycerides: 170 mg/dL — ABNORMAL HIGH (ref 0.0–149.0)
VLDL: 34 mg/dL (ref 0.0–40.0)

## 2018-04-04 ENCOUNTER — Ambulatory Visit: Payer: BC Managed Care – PPO | Admitting: Internal Medicine

## 2018-04-04 ENCOUNTER — Ambulatory Visit: Payer: BC Managed Care – PPO | Admitting: Primary Care

## 2018-04-04 VITALS — BP 124/76 | HR 89 | Temp 97.9°F | Wt 215.0 lb

## 2018-04-04 DIAGNOSIS — J0100 Acute maxillary sinusitis, unspecified: Secondary | ICD-10-CM | POA: Diagnosis not present

## 2018-04-04 MED ORDER — AMOXICILLIN-POT CLAVULANATE 875-125 MG PO TABS: 1.0000 | ORAL_TABLET | Freq: Two times a day (BID) | ORAL | 0 refills | Status: DC

## 2018-04-04 NOTE — Progress Notes (Signed)
HPI  Pt presents to the clinic today with c/o headache, facial pain and pressure, nasal congestion and cough. She reports this started 2 weeks ago. She is blowing green mucous out of her nose. The cough is productive of green mucous. She denies fever, chills or body aches. She has tried Zyrtec, Dymista, Tylenol Cold and Sinus with minimal relief.  She has not had sick contacts. She has no history of allergies or asthma.   Review of Systems     Past Medical History:  Diagnosis Date  . Hypertension     Family History  Problem Relation Age of Onset  . Diabetes Mother        pre diabetic  . Obesity Mother   . Diabetes Father   . Hypertension Father   . Hyperlipidemia Father   . ALS Father   . Cancer Other        breast  . Diabetes Maternal Grandmother   . Diabetes Cousin     Social History   Socioeconomic History  . Marital status: Single    Spouse name: Not on file  . Number of children: 0  . Years of education: Not on file  . Highest education level: Not on file  Occupational History    Employer: [employer]  Social Needs  . Financial resource strain: Not on file  . Food insecurity:    Worry: Not on file    Inability: Not on file  . Transportation needs:    Medical: Not on file    Non-medical: Not on file  Tobacco Use  . Smoking status: Never Smoker  . Smokeless tobacco: Never Used  Substance and Sexual Activity  . Alcohol use: Yes    Comment: occasional  . Drug use: No  . Sexual activity: Not on file  Lifestyle  . Physical activity:    Days per week: Not on file    Minutes per session: Not on file  . Stress: Not on file  Relationships  . Social connections:    Talks on phone: Not on file    Gets together: Not on file    Attends religious service: Not on file    Active member of club or organization: Not on file    Attends meetings of clubs or organizations: Not on file    Relationship status: Not on file  . Intimate partner violence:    Fear of  current or ex partner: Not on file    Emotionally abused: Not on file    Physically abused: Not on file    Forced sexual activity: Not on file  Other Topics Concern  . Not on file  Social History Narrative  . Not on file    No Known Allergies   Constitutional: Positive headache, fatigue. Denies fever or abrupt weight changes.  HEENT:  Positive facial pain, nasal congestion and sore throat. Denies eye redness, ear pain, ringing in the ears, wax buildup, runny nose or bloody nose. Respiratory: Positive cough. Denies difficulty breathing or shortness of breath.  Cardiovascular: Denies chest pain, chest tightness, palpitations or swelling in the hands or feet.   No other specific complaints in a complete review of systems (except as listed in HPI above).  Objective:   BP 124/76   Pulse 89   Temp 97.9 F (36.6 C) (Oral)   Wt 215 lb (97.5 kg)   SpO2 98%   BMI 37.49 kg/m   General: Appears her stated age, well developed, well nourished in NAD. HEENT:  Head: normal shape and size, maxillary sinus tenderness noted; Ears: Tm's gray and intact, normal light reflex; Nose: mucosa boggy and moist, septum midline; Throat/Mouth: + PND. Teeth present, mucosa erythematous and moist, no exudate noted, no lesions or ulcerations noted.  Neck:  No adenopathy noted.  Cardiovascular: Normal rate and rhythm. S1,S2 noted.  No murmur, rubs or gallops noted.  Pulmonary/Chest: Normal effort and positive vesicular breath sounds. No respiratory distress. No wheezes, rales or ronchi noted.       Assessment & Plan:   Acute Maxillary Sinusitis  Can use a Neti Pot which can be purchased from your local drug store. Flonase 2 sprays each nostril for 3 days and then as needed. eRx for Augmentin BID for 10 days  RTC as needed or if symptoms persist. Nicki Reaperegina , NP

## 2018-04-05 ENCOUNTER — Ambulatory Visit: Payer: BC Managed Care – PPO | Admitting: Primary Care

## 2018-04-05 ENCOUNTER — Encounter: Payer: Self-pay | Admitting: Internal Medicine

## 2018-04-05 NOTE — Patient Instructions (Signed)

## 2018-05-28 ENCOUNTER — Encounter: Payer: Self-pay | Admitting: Primary Care

## 2018-05-28 ENCOUNTER — Other Ambulatory Visit: Payer: Self-pay

## 2018-05-28 ENCOUNTER — Ambulatory Visit (INDEPENDENT_AMBULATORY_CARE_PROVIDER_SITE_OTHER): Payer: BC Managed Care – PPO | Admitting: Primary Care

## 2018-05-28 DIAGNOSIS — J019 Acute sinusitis, unspecified: Secondary | ICD-10-CM | POA: Diagnosis not present

## 2018-05-28 MED ORDER — AMOXICILLIN-POT CLAVULANATE 875-125 MG PO TABS: 1.0000 | ORAL_TABLET | Freq: Two times a day (BID) | ORAL | 0 refills | Status: DC

## 2018-05-28 NOTE — Progress Notes (Signed)
Subjective:    Patient ID: Shari Armstrong, female    DOB: 1975/08/25, 43 y.o.   MRN: [id number]  HPI  Virtual Visit via Video Note  I connected with Shari Armstrong on 05/28/18 at 11:40 AM EDT by a video enabled telemedicine application and verified that I am speaking with the correct person using two identifiers.   I discussed the limitations of evaluation and management by telemedicine and the availability of in person appointments. The patient expressed understanding and agreed to proceed. She is at home, I am in the office.  History of Present Illness:  Ms. Melnichuk is a 43 year old female with a history of allergic rhinitis, asthma, sinusitis who presents today via video with a chief complaint of sinus pressure.  She also reports nasal congestion, facial pain to the maxillary sinus region. Her symptoms began about one week ago. She's blowing thick, green mucous from her nasal cavity. She missed her recent allergy shot and thinks this may have contributed. She has a moderate allergy to ragweed and pollen. She's been using her Dymista, Allegra, Singulair as prescribed without much improvement. She's also used Coricidin with temporary improvement. She denies fevers, chills, body aches, cough, shortness of breath. Her last sinusitis infection was in early February 2020.   Observations/Objective:  No cough, speaking in complete sentences. Doesn't appear sickly. No obvious facial swelling.   Assessment and Plan:  History of recurrent sinusitis during Spring season's.  Symptoms and presentation consistent for early sinusitis involvement. Given medical history of allergies/sinusitis without improvement with prescribed regimen, will treat with antibiotic course. Rx for Augmentin sent to pharmacy. Continue prescribed regimen for allergies. Discussed that if symptoms reoccurred then ENT evaluation would be warranted. She verbalized understanding.   Follow Up Instructions:  Start  Augmentin antibiotics for the infection Take 1 tablet by mouth twice daily for 10 days.  Continue your Allegra, Dymista, and Singulair.  Please notify me if no improvement after antibiotic completion and/or if your symptoms return.   Nice to see you today! Mayra Reel, NP-C    I discussed the assessment and treatment plan with the patient. The patient was provided an opportunity to ask questions and all were answered. The patient agreed with the plan and demonstrated an understanding of the instructions.   The patient was advised to call back or seek an in-person evaluation if the symptoms worsen or if the condition fails to improve as anticipated.    Doreene Nest, NP    Review of Systems  Constitutional: Negative for chills, fatigue and fever.  HENT: Positive for congestion, postnasal drip, sinus pressure and sinus pain. Negative for sore throat.   Respiratory: Negative for cough, shortness of breath and wheezing.   Allergic/Immunologic: Positive for environmental allergies.       Past Medical History:  Diagnosis Date  . Hypertension      Social History   Socioeconomic History  . Marital status: Single    Spouse name: Not on file  . Number of children: 0  . Years of education: Not on file  . Highest education level: Not on file  Occupational History    Employer: [employer]  Social Needs  . Financial resource strain: Not on file  . Food insecurity:    Worry: Not on file    Inability: Not on file  . Transportation needs:    Medical: Not on file    Non-medical: Not on file  Tobacco Use  . Smoking status: Never  Smoker  . Smokeless tobacco: Never Used  Substance and Sexual Activity  . Alcohol use: Yes    Comment: occasional  . Drug use: No  . Sexual activity: Not on file  Lifestyle  . Physical activity:    Days per week: Not on file    Minutes per session: Not on file  . Stress: Not on file  Relationships  . Social connections:    Talks on phone:  Not on file    Gets together: Not on file    Attends religious service: Not on file    Active member of club or organization: Not on file    Attends meetings of clubs or organizations: Not on file    Relationship status: Not on file  . Intimate partner violence:    Fear of current or ex partner: Not on file    Emotionally abused: Not on file    Physically abused: Not on file    Forced sexual activity: Not on file  Other Topics Concern  . Not on file  Social History Narrative  . Not on file    Past Surgical History:  Procedure Laterality Date  . WISDOM TOOTH EXTRACTION     age 83    Family History  Problem Relation Age of Onset  . Diabetes Mother        pre diabetic  . Obesity Mother   . Diabetes Father   . Hypertension Father   . Hyperlipidemia Father   . ALS Father   . Cancer Other        breast  . Diabetes Maternal Grandmother   . Diabetes Cousin     No Known Allergies  Current Outpatient Medications on File Prior to Visit  Medication Sig Dispense Refill  . DYMISTA 137-50 MCG/ACT SUSP Place 1 spray into the nose 2 (two) times daily as needed. 23 g 0  . fexofenadine (ALLEGRA) 180 MG tablet Take 180 mg by mouth daily.      . montelukast (SINGULAIR) 10 MG tablet     . norethindrone-ethinyl estradiol (ALYACEN 7/7/7) 0.5/0.75/1-35 MG-MCG tablet Take 1 tablet by mouth daily. 3 Package 3  . ramipril (ALTACE) 10 MG capsule Take 1 tablet by mouth once daily for blood pressure. 90 capsule 3  . triamcinolone cream (KENALOG) 0.1 % Apply 1 application topically 2 (two) times daily. 30 g 0  . albuterol (PROAIR HFA) 108 (90 Base) MCG/ACT inhaler Inhale 2 puffs EVERY 4 HOURS AS NEEDED FOR WHEEZING (Patient not taking: Reported on 05/28/2018) 8.5 g 0   No current facility-administered medications on file prior to visit.     LMP 05/24/2018    Objective:   Physical Exam  Constitutional: She is oriented to person, place, and time. She appears well-nourished.  Respiratory:  Effort normal.  No cough, speaking in complete sentences.  No obvious facial swelling.  Neurological: She is alert and oriented to person, place, and time.  Psychiatric: She has a normal mood and affect.           Assessment & Plan:

## 2018-05-28 NOTE — Assessment & Plan Note (Signed)
Second occurrence since early February 2020. History of recurrent sinusitis during Spring season's.  Symptoms and presentation consistent for early sinusitis involvement. Given medical history of allergies/sinusitis without improvement with prescribed regimen, will treat with antibiotic course. Rx for Augmentin sent to pharmacy. Continue prescribed regimen for allergies. Discussed that if symptoms reoccurred then ENT evaluation would be warranted. She verbalized understanding.

## 2018-05-28 NOTE — Patient Instructions (Signed)
Start Augmentin antibiotics for the infection Take 1 tablet by mouth twice daily for 10 days.  Continue your Allegra, Dymista, and Singulair.  Please notify me if no improvement after antibiotic completion and/or if your symptoms return.   Nice to see you today! Mayra Reel, NP-C

## 2018-05-28 NOTE — Telephone Encounter (Signed)
Shari Armstrong, can you call patient and see if we can add her for today?

## 2018-09-29 ENCOUNTER — Other Ambulatory Visit: Payer: Self-pay | Admitting: Primary Care

## 2018-09-29 DIAGNOSIS — I10 Essential (primary) hypertension: Secondary | ICD-10-CM

## 2018-10-02 ENCOUNTER — Telehealth: Payer: Self-pay

## 2018-10-02 NOTE — Telephone Encounter (Signed)
Left detailed VM w COVID screen and back door lab info and front door Dr appt info    

## 2018-10-04 ENCOUNTER — Other Ambulatory Visit (INDEPENDENT_AMBULATORY_CARE_PROVIDER_SITE_OTHER): Payer: BC Managed Care – PPO

## 2018-10-04 ENCOUNTER — Other Ambulatory Visit: Payer: Self-pay

## 2018-10-04 DIAGNOSIS — I10 Essential (primary) hypertension: Secondary | ICD-10-CM

## 2018-10-04 DIAGNOSIS — R7989 Other specified abnormal findings of blood chemistry: Secondary | ICD-10-CM

## 2018-10-04 DIAGNOSIS — R945 Abnormal results of liver function studies: Secondary | ICD-10-CM | POA: Diagnosis not present

## 2018-10-04 LAB — HEPATIC FUNCTION PANEL
ALT: 76 U/L — ABNORMAL HIGH (ref 0–35)
AST: 66 U/L — ABNORMAL HIGH (ref 0–37)
Albumin: 4.1 g/dL (ref 3.5–5.2)
Alkaline Phosphatase: 66 U/L (ref 39–117)
Bilirubin, Direct: 0.1 mg/dL (ref 0.0–0.3)
Total Bilirubin: 0.3 mg/dL (ref 0.2–1.2)
Total Protein: 6.7 g/dL (ref 6.0–8.3)

## 2018-10-04 LAB — COMPREHENSIVE METABOLIC PANEL
ALT: 76 U/L — ABNORMAL HIGH (ref 0–35)
AST: 66 U/L — ABNORMAL HIGH (ref 0–37)
Albumin: 4.1 g/dL (ref 3.5–5.2)
Alkaline Phosphatase: 66 U/L (ref 39–117)
BUN: 8 mg/dL (ref 6–23)
CO2: 25 mEq/L (ref 19–32)
Calcium: 9.2 mg/dL (ref 8.4–10.5)
Chloride: 105 mEq/L (ref 96–112)
Creatinine, Ser: 0.61 mg/dL (ref 0.40–1.20)
GFR: 107.47 mL/min (ref >60.00)
Glucose, Bld: 99 mg/dL (ref 70–99)
Potassium: 4.2 mEq/L (ref 3.5–5.1)
Sodium: 139 mEq/L (ref 135–145)
Total Bilirubin: 0.3 mg/dL (ref 0.2–1.2)
Total Protein: 6.7 g/dL (ref 6.0–8.3)

## 2018-10-04 LAB — LIPID PANEL
Cholesterol: 162 mg/dL (ref 0–200)
HDL: 30.2 mg/dL — ABNORMAL LOW (ref >39.00)
NonHDL: 131.8
Total CHOL/HDL Ratio: 5
Triglycerides: 223 mg/dL — ABNORMAL HIGH (ref 0.0–149.0)
VLDL: 44.6 mg/dL — ABNORMAL HIGH (ref 0.0–40.0)

## 2018-10-04 LAB — LDL CHOLESTEROL, DIRECT: Direct LDL: 122 mg/dL

## 2018-10-11 ENCOUNTER — Other Ambulatory Visit: Payer: Self-pay

## 2018-10-11 ENCOUNTER — Ambulatory Visit (INDEPENDENT_AMBULATORY_CARE_PROVIDER_SITE_OTHER): Payer: BC Managed Care – PPO | Admitting: Primary Care

## 2018-10-11 ENCOUNTER — Encounter: Payer: Self-pay | Admitting: Primary Care

## 2018-10-11 VITALS — BP 124/72 | HR 90 | Temp 98.1°F | Ht 63.5 in | Wt 222.8 lb

## 2018-10-11 DIAGNOSIS — Z3041 Encounter for surveillance of contraceptive pills: Secondary | ICD-10-CM | POA: Diagnosis not present

## 2018-10-11 DIAGNOSIS — I10 Essential (primary) hypertension: Secondary | ICD-10-CM

## 2018-10-11 DIAGNOSIS — Z0000 Encounter for general adult medical examination without abnormal findings: Secondary | ICD-10-CM

## 2018-10-11 DIAGNOSIS — R945 Abnormal results of liver function studies: Secondary | ICD-10-CM

## 2018-10-11 DIAGNOSIS — R7989 Other specified abnormal findings of blood chemistry: Secondary | ICD-10-CM | POA: Insufficient documentation

## 2018-10-11 DIAGNOSIS — G40909 Epilepsy, unspecified, not intractable, without status epilepticus: Secondary | ICD-10-CM

## 2018-10-11 DIAGNOSIS — Z1239 Encounter for other screening for malignant neoplasm of breast: Secondary | ICD-10-CM

## 2018-10-11 DIAGNOSIS — F419 Anxiety disorder, unspecified: Secondary | ICD-10-CM

## 2018-10-11 DIAGNOSIS — L301 Dyshidrosis [pompholyx]: Secondary | ICD-10-CM

## 2018-10-11 DIAGNOSIS — J301 Allergic rhinitis due to pollen: Secondary | ICD-10-CM

## 2018-10-11 DIAGNOSIS — J4520 Mild intermittent asthma, uncomplicated: Secondary | ICD-10-CM

## 2018-10-11 HISTORY — DX: Other specified abnormal findings of blood chemistry: R79.89

## 2018-10-11 MED ORDER — DYMISTA 137-50 MCG/ACT NA SUSP: 1.0000 | Freq: Two times a day (BID) | NASAL | 0 refills | Status: DC | PRN

## 2018-10-11 MED ORDER — ALYACEN 7/7/7 0.5/0.75/1-35 MG-MCG PO TABS: 1.0000 | ORAL_TABLET | Freq: Every day | ORAL | 3 refills | Status: DC

## 2018-10-11 MED ORDER — RAMIPRIL 10 MG PO CAPS: ORAL_CAPSULE | ORAL | 3 refills | Status: DC

## 2018-10-11 NOTE — Assessment & Plan Note (Signed)
Overall stable, feels like she can manage on her own. Continue to monitor.

## 2018-10-11 NOTE — Assessment & Plan Note (Signed)
Completing allergy injections once weekly. Following with ENT. Compliant to Singulair and Allegra, continue same.

## 2018-10-11 NOTE — Assessment & Plan Note (Signed)
Stable, using triamcinolone cream as needed.

## 2018-10-11 NOTE — Assessment & Plan Note (Signed)
Stable in the office today, continue ramipril. BMP reviewed.

## 2018-10-11 NOTE — Assessment & Plan Note (Signed)
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

## 2018-10-11 NOTE — Assessment & Plan Note (Signed)
Doing well on OCP's. Pap smear UTD, due in 2021. Continue same.

## 2018-10-11 NOTE — Patient Instructions (Signed)
Start exercising. You should be getting 150 minutes of moderate intensity exercise weekly.  It's important to improve your diet by reducing consumption of fast food, fried food, processed snack foods, sugary drinks. Increase consumption of fresh vegetables and fruits, whole grains, water.  Ensure you are drinking 64 ounces of water daily.  Call the breast center to schedule your mammogram.  It was a pleasure to see you today!   Preventive Care 55-66 Years Old, Female Preventive care refers to visits with your health care provider and lifestyle choices that can promote health and wellness. This includes:  A yearly physical exam. This may also be called an annual well check.  Regular dental visits and eye exams.  Immunizations.  Screening for certain conditions.  Healthy lifestyle choices, such as eating a healthy diet, getting regular exercise, not using drugs or products that contain nicotine and tobacco, and limiting alcohol use. What can I expect for my preventive care visit? Physical exam Your health care provider will check your:  Height and weight. This may be used to calculate body mass index (BMI), which tells if you are at a healthy weight.  Heart rate and blood pressure.  Skin for abnormal spots. Counseling Your health care provider may ask you questions about your:  Alcohol, tobacco, and drug use.  Emotional well-being.  Home and relationship well-being.  Sexual activity.  Eating habits.  Work and work Statistician.  Method of birth control.  Menstrual cycle.  Pregnancy history. What immunizations do I need?  Influenza (flu) vaccine  This is recommended every year. Tetanus, diphtheria, and pertussis (Tdap) vaccine  You may need a Td booster every 10 years. Varicella (chickenpox) vaccine  You may need this if you have not been vaccinated. Zoster (shingles) vaccine  You may need this after age 10. Measles, mumps, and rubella (MMR) vaccine   You may need at least one dose of MMR if you were born in 1957 or later. You may also need a second dose. Pneumococcal conjugate (PCV13) vaccine  You may need this if you have certain conditions and were not previously vaccinated. Pneumococcal polysaccharide (PPSV23) vaccine  You may need one or two doses if you smoke cigarettes or if you have certain conditions. Meningococcal conjugate (MenACWY) vaccine  You may need this if you have certain conditions. Hepatitis A vaccine  You may need this if you have certain conditions or if you travel or work in places where you may be exposed to hepatitis A. Hepatitis B vaccine  You may need this if you have certain conditions or if you travel or work in places where you may be exposed to hepatitis B. Haemophilus influenzae type b (Hib) vaccine  You may need this if you have certain conditions. Human papillomavirus (HPV) vaccine  If recommended by your health care provider, you may need three doses over 6 months. You may receive vaccines as individual doses or as more than one vaccine together in one shot (combination vaccines). Talk with your health care provider about the risks and benefits of combination vaccines. What tests do I need? Blood tests  Lipid and cholesterol levels. These may be checked every 5 years, or more frequently if you are over 80 years old.  Hepatitis C test.  Hepatitis B test. Screening  Lung cancer screening. You may have this screening every year starting at age 46 if you have a 30-pack-year history of smoking and currently smoke or have quit within the past 15 years.  Colorectal cancer screening.  All adults should have this screening starting at age 18 and continuing until age 52. Your health care provider may recommend screening at age 63 if you are at increased risk. You will have tests every 1-10 years, depending on your results and the type of screening test.  Diabetes screening. This is done by checking your  blood sugar (glucose) after you have not eaten for a while (fasting). You may have this done every 1-3 years.  Mammogram. This may be done every 1-2 years. Talk with your health care provider about when you should start having regular mammograms. This may depend on whether you have a family history of breast cancer.  BRCA-related cancer screening. This may be done if you have a family history of breast, ovarian, tubal, or peritoneal cancers.  Pelvic exam and Pap test. This may be done every 3 years starting at age 28. Starting at age 55, this may be done every 5 years if you have a Pap test in combination with an HPV test. Other tests  Sexually transmitted disease (STD) testing.  Bone density scan. This is done to screen for osteoporosis. You may have this scan if you are at high risk for osteoporosis. Follow these instructions at home: Eating and drinking  Eat a diet that includes fresh fruits and vegetables, whole grains, lean protein, and low-fat dairy.  Take vitamin and mineral supplements as recommended by your health care provider.  Do not drink alcohol if: ? Your health care provider tells you not to drink. ? You are pregnant, may be pregnant, or are planning to become pregnant.  If you drink alcohol: ? Limit how much you have to 0-1 drink a day. ? Be aware of how much alcohol is in your drink. In the [place] one drink equals one 12 oz bottle of beer (355 mL), one 5 oz glass of wine (148 mL), or one 1 oz glass of hard liquor (44 mL). Lifestyle  Take daily care of your teeth and gums.  Stay active. Exercise for at least 30 minutes on 5 or more days each week.  Do not use any products that contain nicotine or tobacco, such as cigarettes, e-cigarettes, and chewing tobacco. If you need help quitting, ask your health care provider.  If you are sexually active, practice safe sex. Use a condom or other form of birth control (contraception) in order to prevent pregnancy and STIs  (sexually transmitted infections).  If told by your health care provider, take low-dose aspirin daily starting at age 36. What's next?  Visit your health care provider once a year for a well check visit.  Ask your health care provider how often you should have your eyes and teeth checked.  Stay up to date on all vaccines. This information is not intended to replace advice given to you by your health care provider. Make sure you discuss any questions you have with your health care provider. Document Released: 03/12/2015 Document Revised: 10/25/2017 Document Reviewed: 10/25/2017 Elsevier Patient Education  2020 [employer].

## 2018-10-11 NOTE — Assessment & Plan Note (Signed)
Above goal on prior and recent labs. Likely due to fat build up on the liver from poor diet and lack of exercise.  Repeat enzymes in 3 months, if above goal then obtain ultrasound of liver.

## 2018-10-11 NOTE — Assessment & Plan Note (Signed)
Doing well on weekly allergy injections, Singulair, Allegra, Dymista as needed. Infrequent use of albuterol inhaler. Continue to monitor.

## 2018-10-11 NOTE — Assessment & Plan Note (Signed)
Tetanus UTD per patient. Pap smear UTD. Mammogram due, orders placed. Discussed the importance of a healthy diet and regular exercise in order for weight loss, and to reduce the risk of any potential medical problems. Exam today unremarkable. Labs reviewed.

## 2018-10-11 NOTE — Progress Notes (Signed)
Subjective:    Patient ID: Shari Armstrong, female    DOB: 1975/06/23, 43 y.o.   MRN: [id number]  HPI  Shari Armstrong is a 43 year old female who presents today for complete physical.  Immunizations: -Tetanus: Unsure, believes it was 2011 -Influenza: Due this season   Diet: She endorses a fair diet. She mostly eats take out food/restaurant food. Tires to make healthy choices. Desserts/sweets 2-3 times weekly. Drinking soda, water, sweet tea.  Exercise: She is walking some, started doing yoga.   Eye exam: Completed in 2019 Dental exam: Completes semi-annually  Pap Smear: Completed in 2018 Mammogram: Never completed last year, due.  BP Readings from Last 3 Encounters:  10/11/18 124/72  04/04/18 124/76  10/09/17 128/84     Review of Systems  Constitutional: Negative for unexpected weight change.  HENT: Negative for rhinorrhea.   Respiratory: Negative for cough and shortness of breath.   Cardiovascular: Negative for chest pain.  Gastrointestinal: Negative for constipation and diarrhea.  Genitourinary: Negative for difficulty urinating and menstrual problem.  Musculoskeletal: Negative for arthralgias.  Skin: Negative for rash.  Allergic/Immunologic: Positive for environmental allergies.  Neurological: Negative for dizziness, numbness and headaches.  Psychiatric/Behavioral: The patient is not nervous/anxious.      Past Medical History:  Diagnosis Date  . Hypertension      Social History   Socioeconomic History  . Marital status: Single    Spouse name: Not on file  . Number of children: 0  . Years of education: Not on file  . Highest education level: Not on file  Occupational History    Employer: [employer] Needs  . Financial resource strain: Not on file  . Food insecurity    Worry: Not on file    Inability: Not on file  . Transportation needs    Medical: Not on file    Non-medical: Not on file  Tobacco Use  . Smoking status: Never Smoker  .  Smokeless tobacco: Never Used  Substance and Sexual Activity  . Alcohol use: Yes    Comment: occasional  . Drug use: No  . Sexual activity: Not on file  Lifestyle  . Physical activity    Days per week: Not on file    Minutes per session: Not on file  . Stress: Not on file  Relationships  . Social Herbalist on phone: Not on file    Gets together: Not on file    Attends religious service: Not on file    Active member of club or organization: Not on file    Attends meetings of clubs or organizations: Not on file    Relationship status: Not on file  . Intimate partner violence    Fear of current or ex partner: Not on file    Emotionally abused: Not on file    Physically abused: Not on file    Forced sexual activity: Not on file  Other Topics Concern  . Not on file  Social History Narrative  . Not on file    Past Surgical History:  Procedure Laterality Date  . WISDOM TOOTH EXTRACTION     age 21    Family History  Problem Relation Age of Onset  . Diabetes Mother        pre diabetic  . Obesity Mother   . Diabetes Father   . Hypertension Father   . Hyperlipidemia Father   . ALS Father   . Cancer Other  breast  . Diabetes Maternal Grandmother   . Diabetes Cousin     No Known Allergies  Current Outpatient Medications on File Prior to Visit  Medication Sig Dispense Refill  . fexofenadine (ALLEGRA) 180 MG tablet Take 180 mg by mouth daily.      . montelukast (SINGULAIR) 10 MG tablet     . albuterol (PROAIR HFA) 108 (90 Base) MCG/ACT inhaler Inhale 2 puffs EVERY 4 HOURS AS NEEDED FOR WHEEZING (Patient not taking: Reported on 10/11/2018) 8.5 g 0  . triamcinolone cream (KENALOG) 0.1 % Apply 1 application topically 2 (two) times daily. (Patient not taking: Reported on 10/11/2018) 30 g 0   No current facility-administered medications on file prior to visit.     BP 124/72   Pulse 90   Temp 98.1 F (36.7 C) (Temporal)   Ht 5' 3.5" (1.613 m)   Wt 222 lb  12 oz (101 kg)   LMP 10/04/2018   SpO2 98%   BMI 38.84 kg/m    Objective:   Physical Exam  Constitutional: She is oriented to person, place, and time. She appears well-nourished.  HENT:  Mouth/Throat: No oropharyngeal exudate.  Eyes: Pupils are equal, round, and reactive to light. EOM are normal.  Neck: Neck supple. No thyromegaly present.  Cardiovascular: Normal rate and regular rhythm.  Respiratory: Effort normal and breath sounds normal.  GI: Soft. Bowel sounds are normal. There is no abdominal tenderness.  Musculoskeletal: Normal range of motion.  Neurological: She is alert and oriented to person, place, and time.  Skin: Skin is warm and dry.  Psychiatric: She has a normal mood and affect.           Assessment & Plan:

## 2018-11-27 ENCOUNTER — Ambulatory Visit
Admission: RE | Admit: 2018-11-27 | Discharge: 2018-11-27 | Disposition: A | Discharge status: Home / Self Care | Payer: BC Managed Care – PPO | Source: Ambulatory Visit | Attending: Primary Care | Admitting: Primary Care

## 2018-11-27 DIAGNOSIS — R928 Other abnormal and inconclusive findings on diagnostic imaging of breast: Secondary | ICD-10-CM | POA: Insufficient documentation

## 2018-11-27 DIAGNOSIS — Z1231 Encounter for screening mammogram for malignant neoplasm of breast: Secondary | ICD-10-CM | POA: Diagnosis not present

## 2018-11-27 DIAGNOSIS — Z1239 Encounter for other screening for malignant neoplasm of breast: Secondary | ICD-10-CM

## 2018-11-28 ENCOUNTER — Other Ambulatory Visit: Payer: Self-pay | Admitting: Primary Care

## 2018-11-28 DIAGNOSIS — R928 Other abnormal and inconclusive findings on diagnostic imaging of breast: Secondary | ICD-10-CM

## 2018-11-28 DIAGNOSIS — N6310 Unspecified lump in the right breast, unspecified quadrant: Secondary | ICD-10-CM

## 2018-12-10 ENCOUNTER — Ambulatory Visit
Admission: RE | Admit: 2018-12-10 | Discharge: 2018-12-10 | Disposition: A | Discharge status: Home / Self Care | Payer: BC Managed Care – PPO | Source: Ambulatory Visit | Attending: Primary Care | Admitting: Primary Care

## 2018-12-10 DIAGNOSIS — R928 Other abnormal and inconclusive findings on diagnostic imaging of breast: Secondary | ICD-10-CM

## 2018-12-10 DIAGNOSIS — N6310 Unspecified lump in the right breast, unspecified quadrant: Secondary | ICD-10-CM

## 2019-01-03 ENCOUNTER — Other Ambulatory Visit: Payer: Self-pay

## 2019-01-03 DIAGNOSIS — Z20822 Contact with and (suspected) exposure to covid-19: Secondary | ICD-10-CM

## 2019-01-04 LAB — NOVEL CORONAVIRUS, NAA: SARS-CoV-2, NAA: NOT DETECTED

## 2019-01-06 ENCOUNTER — Other Ambulatory Visit: Payer: Self-pay | Admitting: Primary Care

## 2019-01-06 DIAGNOSIS — R7989 Other specified abnormal findings of blood chemistry: Secondary | ICD-10-CM

## 2019-01-08 ENCOUNTER — Other Ambulatory Visit: Payer: BC Managed Care – PPO

## 2019-01-16 ENCOUNTER — Telehealth: Payer: Self-pay

## 2019-01-16 NOTE — Telephone Encounter (Signed)
LVM w COVID screen, front door and back lab in fo 11.19.2020 TLJ 

## 2019-01-27 ENCOUNTER — Other Ambulatory Visit: Payer: Self-pay

## 2019-01-27 ENCOUNTER — Other Ambulatory Visit (INDEPENDENT_AMBULATORY_CARE_PROVIDER_SITE_OTHER): Payer: BC Managed Care – PPO

## 2019-01-27 DIAGNOSIS — R7989 Other specified abnormal findings of blood chemistry: Secondary | ICD-10-CM | POA: Diagnosis not present

## 2019-01-27 DIAGNOSIS — D729 Disorder of white blood cells, unspecified: Secondary | ICD-10-CM

## 2019-01-28 ENCOUNTER — Other Ambulatory Visit (INDEPENDENT_AMBULATORY_CARE_PROVIDER_SITE_OTHER): Payer: BC Managed Care – PPO

## 2019-01-28 DIAGNOSIS — D729 Disorder of white blood cells, unspecified: Secondary | ICD-10-CM

## 2019-01-28 LAB — CBC
HCT: 43.3 % (ref 36.0–46.0)
Hemoglobin: 14.6 g/dL (ref 12.0–15.0)
MCHC: 33.8 g/dL (ref 30.0–36.0)
MCV: 84.3 fl (ref 78.0–100.0)
Platelets: 358 10*3/uL (ref 150.0–400.0)
RBC: 5.14 Mil/uL — ABNORMAL HIGH (ref 3.87–5.11)
RDW: 13.6 % (ref 11.5–15.5)
WBC: 13.2 10*3/uL — ABNORMAL HIGH (ref 4.0–10.5)

## 2019-01-28 LAB — HEPATIC FUNCTION PANEL
ALT: 58 U/L — ABNORMAL HIGH (ref 0–35)
AST: 34 U/L (ref 0–37)
Albumin: 4.3 g/dL (ref 3.5–5.2)
Alkaline Phosphatase: 75 U/L (ref 39–117)
Bilirubin, Direct: 0.1 mg/dL (ref 0.0–0.3)
Total Bilirubin: 0.3 mg/dL (ref 0.2–1.2)
Total Protein: 7 g/dL (ref 6.0–8.3)

## 2019-01-28 LAB — WHITE CELL DIFFERENTIAL
Basophils Relative: 0.7 % (ref 0.0–3.0)
Eosinophils Relative: 1.8 % (ref 0.0–5.0)
Lymphocytes Relative: 29.3 % (ref 12.0–46.0)
Monocytes Relative: 4.9 % (ref 3.0–12.0)
Neutrophils Relative %: 63.3 % (ref 43.0–77.0)

## 2019-01-28 NOTE — Addendum Note (Signed)
Addended by: Cloyd Stagers on: 01/28/2019 02:33 PM   Modules accepted: Orders

## 2019-01-29 DIAGNOSIS — D729 Disorder of white blood cells, unspecified: Secondary | ICD-10-CM

## 2019-01-29 LAB — PATHOLOGIST SMEAR REVIEW

## 2019-02-24 ENCOUNTER — Inpatient Hospital Stay: Payer: BC Managed Care – PPO

## 2019-02-24 ENCOUNTER — Inpatient Hospital Stay: Payer: BC Managed Care – PPO | Attending: Oncology | Admitting: Oncology

## 2019-02-24 ENCOUNTER — Other Ambulatory Visit: Payer: Self-pay

## 2019-02-24 ENCOUNTER — Encounter: Payer: Self-pay | Admitting: Oncology

## 2019-02-24 VITALS — BP 133/92 | HR 88 | Temp 97.6°F | Resp 16 | Wt 211.5 lb

## 2019-02-24 DIAGNOSIS — D751 Secondary polycythemia: Secondary | ICD-10-CM | POA: Diagnosis not present

## 2019-02-24 DIAGNOSIS — D729 Disorder of white blood cells, unspecified: Secondary | ICD-10-CM

## 2019-02-24 DIAGNOSIS — Z803 Family history of malignant neoplasm of breast: Secondary | ICD-10-CM

## 2019-02-24 LAB — COMPREHENSIVE METABOLIC PANEL
ALT: 43 U/L (ref 0–44)
AST: 33 U/L (ref 15–41)
Albumin: 4 g/dL (ref 3.5–5.0)
Alkaline Phosphatase: 73 U/L (ref 38–126)
Anion gap: 7 (ref 5–15)
BUN: 13 mg/dL (ref 6–20)
CO2: 26 mmol/L (ref 22–32)
Calcium: 9.1 mg/dL (ref 8.9–10.3)
Chloride: 107 mmol/L (ref 98–111)
Creatinine, Ser: 0.66 mg/dL (ref 0.44–1.00)
GFR calc Af Amer: >60 mL/min (ref >60)
GFR calc non Af Amer: >60 mL/min (ref >60)
Glucose, Bld: 116 mg/dL — ABNORMAL HIGH (ref 70–99)
Potassium: 4.6 mmol/L (ref 3.5–5.1)
Sodium: 140 mmol/L (ref 135–145)
Total Bilirubin: 0.4 mg/dL (ref 0.3–1.2)
Total Protein: 7.3 g/dL (ref 6.5–8.1)

## 2019-02-24 LAB — TECHNOLOGIST SMEAR REVIEW: Plt Morphology: ADEQUATE

## 2019-02-24 LAB — CBC WITH DIFFERENTIAL/PLATELET
Abs Immature Granulocytes: 0.08 10*3/uL — ABNORMAL HIGH (ref 0.00–0.07)
Basophils Absolute: 0.1 10*3/uL (ref 0.0–0.1)
Basophils Relative: 1 %
Eosinophils Absolute: 0.3 10*3/uL (ref 0.0–0.5)
Eosinophils Relative: 3 %
HCT: 46.1 % — ABNORMAL HIGH (ref 36.0–46.0)
Hemoglobin: 15.1 g/dL — ABNORMAL HIGH (ref 12.0–15.0)
Immature Granulocytes: 1 %
Lymphocytes Relative: 34 %
Lymphs Abs: 3.6 10*3/uL (ref 0.7–4.0)
MCH: 27.7 pg (ref 26.0–34.0)
MCHC: 32.8 g/dL (ref 30.0–36.0)
MCV: 84.4 fL (ref 80.0–100.0)
Monocytes Absolute: 0.5 10*3/uL (ref 0.1–1.0)
Monocytes Relative: 4 %
Neutro Abs: 6.3 10*3/uL (ref 1.7–7.7)
Neutrophils Relative %: 57 %
Platelets: 341 10*3/uL (ref 150–400)
RBC: 5.46 MIL/uL — ABNORMAL HIGH (ref 3.87–5.11)
RDW: 12.8 % (ref 11.5–15.5)
WBC: 10.8 10*3/uL — ABNORMAL HIGH (ref 4.0–10.5)
nRBC: 0 % (ref 0.0–0.2)

## 2019-02-24 LAB — LACTATE DEHYDROGENASE: LDH: 117 U/L (ref 98–192)

## 2019-02-24 NOTE — Progress Notes (Signed)
Hematology/Oncology Consult note [facility] [phone] [phone]   Patient Care Team: Pleas Koch, NP as PCP - General (Internal Medicine)  REFERRING PROVIDER: Pleas Koch, NP  CHIEF COMPLAINTS/REASON FOR VISIT:  Evaluation of leukocytosis  HISTORY OF PRESENTING ILLNESS:  Shari Armstrong is a  43 y.o.  female with PMH listed below who was referred to me for evaluation of leukocytosis Reviewed patient' recent labs obtained by PCP.  01/27/2019 CBC showed elevated white count 13.2 Previous lab records reviewed. Leukocytosis onset of chronic, duration is since at least 2010.  Chronically neutrophilia  No aggravating or elevated factors. Associated symptoms or signs:  Denies weight loss, fever, chills,night sweats.  Denies fatigue Smoking history: Never smoker History of recent oral steroid use or steroid injection: Denies History of recent infection: Denies Autoimmune disease history.  Denies Denies any IUD or prosthesis device  Review of Systems  Constitutional: Negative for appetite change, chills, fatigue and fever.  HENT:   Negative for hearing loss and voice change.   Eyes: Negative for eye problems.  Respiratory: Negative for chest tightness and cough.   Cardiovascular: Negative for chest pain.  Gastrointestinal: Negative for abdominal distention, abdominal pain and blood in stool.  Endocrine: Negative for hot flashes.  Genitourinary: Negative for difficulty urinating and frequency.   Musculoskeletal: Negative for arthralgias.  Skin: Negative for itching and rash.  Neurological: Negative for extremity weakness.  Hematological: Negative for adenopathy.  Psychiatric/Behavioral: Negative for confusion.     MEDICAL HISTORY:  Past Medical History:  Diagnosis Date  . Allergy   . Hypertension   . Seizures ([place])    last seizure at 43 yo    SURGICAL HISTORY: Past Surgical History:  Procedure Laterality Date    . WISDOM TOOTH EXTRACTION     age 89    SOCIAL HISTORY: Social History   Socioeconomic History  . Marital status: Single    Spouse name: Not on file  . Number of children: 0  . Years of education: Not on file  . Highest education level: Not on file  Occupational History    Employer: [employer]  Tobacco Use  . Smoking status: Never Smoker  . Smokeless tobacco: Never Used  Substance and Sexual Activity  . Alcohol use: Yes    Comment: occasional  . Drug use: No  . Sexual activity: Not on file  Other Topics Concern  . Not on file  Social History Narrative  . Not on file   Social Determinants of Health   Financial Resource Strain:   . Difficulty of Paying Living Expenses: Not on file  Food Insecurity:   . Worried About Charity fundraiser in the Last Year: Not on file  . Ran Out of Food in the Last Year: Not on file  Transportation Needs:   . Lack of Transportation (Medical): Not on file  . Lack of Transportation (Non-Medical): Not on file  Physical Activity:   . Days of Exercise per Week: Not on file  . Minutes of Exercise per Session: Not on file  Stress:   . Feeling of Stress : Not on file  Social Connections:   . Frequency of Communication with Friends and Family: Not on file  . Frequency of Social Gatherings with Friends and Family: Not on file  . Attends Religious Services: Not on file  . Active Member of Clubs or Organizations: Not on file  . Attends Archivist Meetings: Not on file  .  Marital Status: Not on file  Intimate Partner Violence:   . Fear of Current or Ex-Partner: Not on file  . Emotionally Abused: Not on file  . Physically Abused: Not on file  . Sexually Abused: Not on file    FAMILY HISTORY: Family History  Problem Relation Age of Onset  . Diabetes Mother        pre diabetic  . Obesity Mother   . Diabetes Father   . Hypertension Father   . Hyperlipidemia Father   . ALS Father   . Cancer Other        breast  .  Diabetes Maternal Grandmother   . Diabetes Cousin     ALLERGIES:  has No Known Allergies.  MEDICATIONS:  Current Outpatient Medications  Medication Sig Dispense Refill  . DYMISTA 137-50 MCG/ACT SUSP Place 1 spray into the nose 2 (two) times daily as needed. 23 g 0  . fexofenadine (ALLEGRA) 180 MG tablet Take 180 mg by mouth daily.      . Melatonin 1 MG TABS Take by mouth. prn    . montelukast (SINGULAIR) 10 MG tablet     . Multiple Vitamins-Minerals (WOMENS MULTI PO) Take by mouth daily.    . norethindrone-ethinyl estradiol (ALYACEN 7/7/7) 0.5/0.75/1-35 MG-MCG tablet Take 1 tablet by mouth daily. 3 Package 3  . ramipril (ALTACE) 10 MG capsule Take 1 tablet by mouth once daily for blood pressure. 90 capsule 3  . albuterol (PROAIR HFA) 108 (90 Base) MCG/ACT inhaler Inhale 2 puffs EVERY 4 HOURS AS NEEDED FOR WHEEZING (Patient not taking: Reported on 02/24/2019) 8.5 g 0  . triamcinolone cream (KENALOG) 0.1 % Apply 1 application topically 2 (two) times daily. (Patient not taking: Reported on 10/11/2018) 30 g 0   No current facility-administered medications for this visit.     PHYSICAL EXAMINATION: ECOG PERFORMANCE STATUS: 0 - Asymptomatic Vitals:   02/24/19 0954  BP: (!) 133/92  Pulse: 88  Resp: 16  Temp: 97.6 F (36.4 C)   Filed Weights   02/24/19 0954  Weight: 211 lb 8 oz (95.9 kg)    Physical Exam Constitutional:      General: She is not in acute distress. HENT:     Head: Normocephalic and atraumatic.  Eyes:     General: No scleral icterus.    Pupils: Pupils are equal, round, and reactive to light.  Cardiovascular:     Rate and Rhythm: Normal rate and regular rhythm.     Heart sounds: Normal heart sounds.  Pulmonary:     Effort: Pulmonary effort is normal. No respiratory distress.     Breath sounds: No wheezing.  Abdominal:     General: Bowel sounds are normal. There is no distension.     Palpations: Abdomen is soft. There is no mass.     Tenderness: There is no  abdominal tenderness.  Musculoskeletal:        General: No deformity. Normal range of motion.     Cervical back: Normal range of motion and neck supple.  Skin:    General: Skin is warm and dry.     Findings: No erythema or rash.  Neurological:     Mental Status: She is alert and oriented to person, place, and time.     Cranial Nerves: No cranial nerve deficit.     Coordination: Coordination normal.  Psychiatric:        Behavior: Behavior normal.        Thought Content: Thought content normal.  CMP Latest Ref Rng & Units 02/24/2019  Glucose 70 - 99 mg/dL 176(H)  BUN 6 - 20 mg/dL 13  Creatinine 6.07 - 3.71 mg/dL 0.62  Sodium 694 - 854 mmol/L 140  Potassium 3.5 - 5.1 mmol/L 4.6  Chloride 98 - 111 mmol/L 107  CO2 22 - 32 mmol/L 26  Calcium 8.9 - 10.3 mg/dL 9.1  Total Protein 6.5 - 8.1 g/dL 7.3  Total Bilirubin 0.3 - 1.2 mg/dL 0.4  Alkaline Phos 38 - 126 U/L 73  AST 15 - 41 U/L 33  ALT 0 - 44 U/L 43   CBC Latest Ref Rng & Units 02/24/2019  WBC 4.0 - 10.5 K/uL 10.8(H)  Hemoglobin 12.0 - 15.0 g/dL 15.1(H)  Hematocrit 36.0 - 46.0 % 46.1(H)  Platelets 150 - 400 K/uL 341     No results found.  LABORATORY DATA:  I have reviewed the data as listed Lab Results  Component Value Date   WBC 10.8 (H) 02/24/2019   HGB 15.1 (H) 02/24/2019   HCT 46.1 (H) 02/24/2019   MCV 84.4 02/24/2019   PLT 341 02/24/2019   Recent Labs    10/04/18 0808 01/27/19 1509 02/24/19 1034  NA 139  --  140  K 4.2  --  4.6  CL 105  --  107  CO2 25  --  26  GLUCOSE 99  --  116*  BUN 8  --  13  CREATININE 0.61  --  0.66  CALCIUM 9.2  --  9.1  GFRNONAA  --   --  >60  GFRAA  --   --  >60  PROT 6.7  6.7 7.0 7.3  ALBUMIN 4.1  4.1 4.3 4.0  AST 66*  66* 34 33  ALT 76*  76* 58* 43  ALKPHOS 66  66 75 73  BILITOT 0.3  0.3 0.3 0.4  BILIDIR 0.1 0.1  --    Iron/TIBC/Ferritin/ %Sat No results found for: IRON, TIBC, FERRITIN, IRONPCTSAT      ASSESSMENT & PLAN:  1. Neutrophilia   2.  Erythrocytosis    Labs reviewed and discussed with patient that Leukocytosis, predominantly neutrophilia, can be secondary to infection, chronic inflammation, smoking, autoimmune disease, or underlying bone marrow disorders.  The chronicity of leukocytosis/neutrophilia indicates a chronic process probably reactive. For the work up of patient's leukocytosis, I recommend checking CBC;CMP, LDH,  smear review, flowcytometry, etc. Erythrocytosis, very mild.  May consider work-up for erythrocytosis in the future if persist.   Orders Placed This Encounter  Procedures  . CBC with Differential/Platelet    Standing Status:   Future    Number of Occurrences:   1    Standing Expiration Date:   02/24/2020  . Comprehensive metabolic panel    Standing Status:   Future    Number of Occurrences:   1    Standing Expiration Date:   02/24/2020  . Comp panel: Leukemia/Lymphoma    Standing Status:   Future    Number of Occurrences:   1    Standing Expiration Date:   02/24/2020  . Lactate dehydrogenase    Standing Status:   Future    Number of Occurrences:   1    Standing Expiration Date:   02/24/2020  . Technologist smear review    Standing Status:   Future    Number of Occurrences:   1    Standing Expiration Date:   02/24/2020    All questions were answered. The patient knows to call the clinic  with any problems questions or concerns.  Return of visit: 2 weeks to discuss labs. Thank you for this kind referral and the opportunity to participate in the care of this patient. A copy of today's note is routed to referring provider  Total face to face encounter time for this patient visit was 45 min. >50% of the time was  spent in counseling and coordination of care.    Rickard Patience, MD, PhD Hematology Oncology [facility] Pager- [phone] 02/24/2019

## 2019-03-03 LAB — COMP PANEL: LEUKEMIA/LYMPHOMA

## 2019-03-17 ENCOUNTER — Encounter: Payer: Self-pay | Admitting: Oncology

## 2019-03-17 ENCOUNTER — Inpatient Hospital Stay: Payer: BC Managed Care – PPO | Attending: Oncology | Admitting: Oncology

## 2019-03-17 ENCOUNTER — Other Ambulatory Visit: Payer: Self-pay | Admitting: Oncology

## 2019-03-17 DIAGNOSIS — I10 Essential (primary) hypertension: Secondary | ICD-10-CM | POA: Diagnosis not present

## 2019-03-17 DIAGNOSIS — D72829 Elevated white blood cell count, unspecified: Secondary | ICD-10-CM | POA: Insufficient documentation

## 2019-03-17 DIAGNOSIS — D751 Secondary polycythemia: Secondary | ICD-10-CM

## 2019-03-17 NOTE — Progress Notes (Signed)
Patient verified using two identifiers for virtual visit via telephone today.  Patient does not offer any problems today.  

## 2019-03-17 NOTE — Progress Notes (Signed)
HEMATOLOGY-ONCOLOGY TeleHEALTH VISIT PROGRESS NOTE  I connected with Shari Armstrong on 03/17/19 at  2:30 PM EST by video enabled telemedicine visit and verified that I am speaking with the correct person using two identifiers. I discussed the limitations, risks, security and privacy concerns of performing an evaluation and management service by telemedicine and the availability of in-person appointments. I also discussed with the patient that there may be a patient responsible charge related to this service. The patient expressed understanding and agreed to proceed.   Other persons participating in the visit and their role in the encounter:  None  Patient's location: Home  Provider's location: office Chief Complaint: Neutrophilia and erythrocytosis   INTERVAL HISTORY Shari Armstrong is a 44 y.o. female who has above history reviewed by me today presents for follow up visit for management of neutrophilia and erythrocytosis  Patient denies no new complaints today.  She had blood work done and present virtually to discuss the results and management plan.  Review of Systems  Constitutional: Negative for appetite change, chills, fatigue and fever.  HENT:   Negative for hearing loss and voice change.   Eyes: Negative for eye problems.  Respiratory: Negative for chest tightness and cough.   Cardiovascular: Negative for chest pain.  Gastrointestinal: Negative for abdominal distention, abdominal pain and blood in stool.  Endocrine: Negative for hot flashes.  Genitourinary: Negative for difficulty urinating and frequency.   Musculoskeletal: Negative for arthralgias.  Skin: Negative for itching and rash.  Neurological: Negative for extremity weakness.  Hematological: Negative for adenopathy.  Psychiatric/Behavioral: Negative for confusion.    Past Medical History:  Diagnosis Date  . Allergy   . Hypertension   . Seizures ([facility])    last seizure at 44 yo   Past Surgical History:  Procedure  Laterality Date  . WISDOM TOOTH EXTRACTION     age 36    Family History  Problem Relation Age of Onset  . Diabetes Mother        pre diabetic  . Obesity Mother   . Diabetes Father   . Hypertension Father   . Hyperlipidemia Father   . ALS Father   . Cancer Other        breast  . Diabetes Maternal Grandmother   . Diabetes Cousin     Social History   Socioeconomic History  . Marital status: Single    Spouse name: Not on file  . Number of children: 0  . Years of education: Not on file  . Highest education level: Not on file  Occupational History    Employer: [employer]  Tobacco Use  . Smoking status: Never Smoker  . Smokeless tobacco: Never Used  Substance and Sexual Activity  . Alcohol use: Yes    Comment: occasional  . Drug use: No  . Sexual activity: Not on file  Other Topics Concern  . Not on file  Social History Narrative  . Not on file   Social Determinants of Health   Financial Resource Strain:   . Difficulty of Paying Living Expenses: Not on file  Food Insecurity:   . Worried About Programme researcher, broadcasting/film/video in the Last Year: Not on file  . Ran Out of Food in the Last Year: Not on file  Transportation Needs:   . Lack of Transportation (Medical): Not on file  . Lack of Transportation (Non-Medical): Not on file  Physical Activity:   . Days of Exercise per Week: Not on file  . Minutes  of Exercise per Session: Not on file  Stress:   . Feeling of Stress : Not on file  Social Connections:   . Frequency of Communication with Friends and Family: Not on file  . Frequency of Social Gatherings with Friends and Family: Not on file  . Attends Religious Services: Not on file  . Active Member of Clubs or Organizations: Not on file  . Attends Archivist Meetings: Not on file  . Marital Status: Not on file  Intimate Partner Violence:   . Fear of Current or Ex-Partner: Not on file  . Emotionally Abused: Not on file  . Physically Abused: Not on file  .  Sexually Abused: Not on file    Current Outpatient Medications on File Prior to Visit  Medication Sig Dispense Refill  . albuterol (PROAIR HFA) 108 (90 Base) MCG/ACT inhaler Inhale 2 puffs EVERY 4 HOURS AS NEEDED FOR WHEEZING 8.5 g 0  . DYMISTA 137-50 MCG/ACT SUSP Place 1 spray into the nose 2 (two) times daily as needed. 23 g 0  . fexofenadine (ALLEGRA) 180 MG tablet Take 180 mg by mouth daily.      . Melatonin 1 MG TABS Take by mouth. prn    . montelukast (SINGULAIR) 10 MG tablet     . Multiple Vitamins-Minerals (WOMENS MULTI PO) Take by mouth daily.    . norethindrone-ethinyl estradiol (ALYACEN 7/7/7) 0.5/0.75/1-35 MG-MCG tablet Take 1 tablet by mouth daily. 3 Package 3  . ramipril (ALTACE) 10 MG capsule Take 1 tablet by mouth once daily for blood pressure. 90 capsule 3   No current facility-administered medications on file prior to visit.    No Known Allergies     Observations/Objective: Today's Vitals   03/17/19 1423  PainSc: 0-No pain   There is no height or weight on file to calculate BMI.  Physical Exam  Constitutional: No distress.  Neurological: She is alert.  Psychiatric: Mood normal.    CBC    Component Value Date/Time   WBC 10.8 (H) 02/24/2019 1034   RBC 5.46 (H) 02/24/2019 1034   HGB 15.1 (H) 02/24/2019 1034   HCT 46.1 (H) 02/24/2019 1034   PLT 341 02/24/2019 1034   MCV 84.4 02/24/2019 1034   MCH 27.7 02/24/2019 1034   MCHC 32.8 02/24/2019 1034   RDW 12.8 02/24/2019 1034   LYMPHSABS 3.6 02/24/2019 1034   MONOABS 0.5 02/24/2019 1034   EOSABS 0.3 02/24/2019 1034   BASOSABS 0.1 02/24/2019 1034    CMP     Component Value Date/Time   NA 140 02/24/2019 1034   K 4.6 02/24/2019 1034   CL 107 02/24/2019 1034   CO2 26 02/24/2019 1034   GLUCOSE 116 (H) 02/24/2019 1034   BUN 13 02/24/2019 1034   CREATININE 0.66 02/24/2019 1034   CALCIUM 9.1 02/24/2019 1034   PROT 7.3 02/24/2019 1034   ALBUMIN 4.0 02/24/2019 1034   AST 33 02/24/2019 1034   ALT 43  02/24/2019 1034   ALKPHOS 73 02/24/2019 1034   BILITOT 0.4 02/24/2019 1034   GFRNONAA >60 02/24/2019 1034   GFRAA >60 02/24/2019 1034     Assessment and Plan: 1. Erythrocytosis   2. Leukocytosis, unspecified type     Labs are reviewed and discussed with patient. Chronic leukocytosis, peripheral smear showed absolute lymphocytosis and neutrophilia with mild left shift immature ration.  Favor reactive process.  She has a history of chronic leukocytosis with intermittent neutrophilia and or lymphocytosis. Repeat blood work 02/24/2019 showed mired leukocytosis with  WBC 10.8, normal neutrophil and lymphocyte counts. She has normal LDH and normal peripheral blood flow cytometry.  No constitutional symptoms. Leukocytosis likely reactive.  Recommend observation.  #Mild erythrocytosis with hemoglobin 15.1.  I suggest observation and repeat CBC in 6 months.  Follow Up Instructions: 6 months   I discussed the assessment and treatment plan with the patient. The patient was provided an opportunity to ask questions and all were answered. The patient agreed with the plan and demonstrated an understanding of the instructions.  The patient was advised to call back or seek an in-person evaluation if the symptoms worsen or if the condition fails to improve as anticipated.    Rickard Patience, MD 03/17/2019 6:28 PM

## 2019-08-30 ENCOUNTER — Other Ambulatory Visit: Payer: Self-pay | Admitting: Primary Care

## 2019-08-30 DIAGNOSIS — I10 Essential (primary) hypertension: Secondary | ICD-10-CM

## 2019-09-15 ENCOUNTER — Other Ambulatory Visit: Payer: Self-pay

## 2019-09-15 ENCOUNTER — Inpatient Hospital Stay: Payer: BC Managed Care – PPO | Attending: Oncology

## 2019-09-15 ENCOUNTER — Other Ambulatory Visit: Payer: Self-pay | Admitting: Primary Care

## 2019-09-15 ENCOUNTER — Encounter: Payer: Self-pay | Admitting: Oncology

## 2019-09-15 ENCOUNTER — Inpatient Hospital Stay: Payer: BC Managed Care – PPO | Admitting: Oncology

## 2019-09-15 VITALS — BP 125/91 | HR 90 | Temp 96.8°F | Resp 18 | Wt 213.7 lb

## 2019-09-15 DIAGNOSIS — Z79899 Other long term (current) drug therapy: Secondary | ICD-10-CM | POA: Diagnosis not present

## 2019-09-15 DIAGNOSIS — D751 Secondary polycythemia: Secondary | ICD-10-CM | POA: Diagnosis not present

## 2019-09-15 DIAGNOSIS — Z8249 Family history of ischemic heart disease and other diseases of the circulatory system: Secondary | ICD-10-CM | POA: Insufficient documentation

## 2019-09-15 DIAGNOSIS — Z803 Family history of malignant neoplasm of breast: Secondary | ICD-10-CM | POA: Insufficient documentation

## 2019-09-15 DIAGNOSIS — Z8349 Family history of other endocrine, nutritional and metabolic diseases: Secondary | ICD-10-CM | POA: Diagnosis not present

## 2019-09-15 DIAGNOSIS — D72829 Elevated white blood cell count, unspecified: Secondary | ICD-10-CM | POA: Diagnosis present

## 2019-09-15 DIAGNOSIS — Z833 Family history of diabetes mellitus: Secondary | ICD-10-CM | POA: Insufficient documentation

## 2019-09-15 DIAGNOSIS — D729 Disorder of white blood cells, unspecified: Secondary | ICD-10-CM

## 2019-09-15 DIAGNOSIS — I10 Essential (primary) hypertension: Secondary | ICD-10-CM | POA: Insufficient documentation

## 2019-09-15 LAB — CBC WITH DIFFERENTIAL/PLATELET
Abs Immature Granulocytes: 0.11 10*3/uL — ABNORMAL HIGH (ref 0.00–0.07)
Basophils Absolute: 0.1 10*3/uL (ref 0.0–0.1)
Basophils Relative: 0 %
Eosinophils Absolute: 0.2 10*3/uL (ref 0.0–0.5)
Eosinophils Relative: 2 %
HCT: 40.4 % (ref 36.0–46.0)
Hemoglobin: 13.8 g/dL (ref 12.0–15.0)
Immature Granulocytes: 1 %
Lymphocytes Relative: 27 %
Lymphs Abs: 3.7 10*3/uL (ref 0.7–4.0)
MCH: 27.7 pg (ref 26.0–34.0)
MCHC: 34.2 g/dL (ref 30.0–36.0)
MCV: 81.1 fL (ref 80.0–100.0)
Monocytes Absolute: 0.4 10*3/uL (ref 0.1–1.0)
Monocytes Relative: 3 %
Neutro Abs: 9.2 10*3/uL — ABNORMAL HIGH (ref 1.7–7.7)
Neutrophils Relative %: 67 %
Platelets: 332 10*3/uL (ref 150–400)
RBC: 4.98 MIL/uL (ref 3.87–5.11)
RDW: 13.2 % (ref 11.5–15.5)
WBC: 13.8 10*3/uL — ABNORMAL HIGH (ref 4.0–10.5)
nRBC: 0 % (ref 0.0–0.2)

## 2019-09-15 NOTE — Progress Notes (Signed)
Hematology/Oncology follow up note [facility] [phone] [phone]   Patient Care Team: Pleas Koch, NP as PCP - General (Internal Medicine)  REFERRING PROVIDER: Pleas Koch, NP  CHIEF COMPLAINTS/REASON FOR VISIT:  Follow up of leukocytosis  INTERVAL HISTORY Shari Armstrong is a 44 y.o. female who has above history reviewed by me today presents for follow up visit for  Leukocytosis Problems and complaints are listed below.  Patient reports feeling well.  Denies any constitutional symptoms.  He reports history of chronic allergy, mainly sinus symptoms.  Denies shortness of breath or cough. She gets allergy shots periodically-currently every week-for the past few years Denies any fever, chills, chronic wound, smoking history, prosthesis, IUD etc.  Review of Systems  Constitutional: Negative for appetite change, chills, fatigue and fever.  HENT:   Negative for hearing loss and voice change.        Chronic sinus problems, allergy,  Eyes: Negative for eye problems.  Respiratory: Negative for chest tightness and cough.   Cardiovascular: Negative for chest pain.  Gastrointestinal: Negative for abdominal distention, abdominal pain and blood in stool.  Endocrine: Negative for hot flashes.  Genitourinary: Negative for difficulty urinating and frequency.   Musculoskeletal: Negative for arthralgias.  Skin: Negative for itching and rash.  Neurological: Negative for extremity weakness.  Hematological: Negative for adenopathy.  Psychiatric/Behavioral: Negative for confusion.     MEDICAL HISTORY:  Past Medical History:  Diagnosis Date   Allergy    Hypertension    Seizures ([place])    last seizure at 44 yo    SURGICAL HISTORY: Past Surgical History:  Procedure Laterality Date   WISDOM TOOTH EXTRACTION     age 89    SOCIAL HISTORY: Social History   Socioeconomic History   Marital status: Single    Spouse name: Not  on file   Number of children: 0   Years of education: Not on file   Highest education level: Not on file  Occupational History    Employer: [employer]  Tobacco Use   Smoking status: Never Smoker   Smokeless tobacco: Never Used  Substance and Sexual Activity   Alcohol use: Yes    Comment: occasional   Drug use: No   Sexual activity: Not on file  Other Topics Concern   Not on file  Social History Narrative   Not on file   Social Determinants of Health   Financial Resource Strain:    Difficulty of Paying Living Expenses:   Food Insecurity:    Worried About Charity fundraiser in the Last Year:    Arboriculturist in the Last Year:   Transportation Needs:    Film/video editor (Medical):    Lack of Transportation (Non-Medical):   Physical Activity:    Days of Exercise per Week:    Minutes of Exercise per Session:   Stress:    Feeling of Stress :   Social Connections:    Frequency of Communication with Friends and Family:    Frequency of Social Gatherings with Friends and Family:    Attends Religious Services:    Active Member of Clubs or Organizations:    Attends Music therapist:    Marital Status:   Intimate Partner Violence:    Fear of Current or Ex-Partner:    Emotionally Abused:    Physically Abused:    Sexually Abused:     FAMILY HISTORY: Family History  Problem Relation Age of Onset  Diabetes Mother        pre diabetic   Obesity Mother    Diabetes Father    Hypertension Father    Hyperlipidemia Father    ALS Father    Cancer Other        breast   Diabetes Maternal Grandmother    Diabetes Cousin     ALLERGIES:  has No Known Allergies.  MEDICATIONS:  Current Outpatient Medications  Medication Sig Dispense Refill   albuterol (PROAIR HFA) 108 (90 Base) MCG/ACT inhaler Inhale 2 puffs EVERY 4 HOURS AS NEEDED FOR WHEEZING 8.5 g 0   DYMISTA 137-50 MCG/ACT SUSP Place 1 spray into the nose 2  (two) times daily as needed. 23 g 0   fexofenadine (ALLEGRA) 180 MG tablet Take 180 mg by mouth daily.       Melatonin 1 MG TABS Take by mouth. prn     montelukast (SINGULAIR) 10 MG tablet      Multiple Vitamins-Minerals (WOMENS MULTI PO) Take by mouth daily.     norethindrone-ethinyl estradiol (ALYACEN 7/7/7) 0.5/0.75/1-35 MG-MCG tablet Take 1 tablet by mouth daily. 3 Package 3   ramipril (ALTACE) 10 MG capsule TAKE 1 CAPSULE BY MOUTH EVERY DAY FOR BLOOD PRESSURE 90 capsule 0   No current facility-administered medications for this visit.     PHYSICAL EXAMINATION: ECOG PERFORMANCE STATUS: 0 - Asymptomatic Vitals:   09/15/19 1313  BP: (!) 125/91  Pulse: 90  Resp: 18  Temp: (!) 96.8 F (36 C)   Filed Weights   09/15/19 1313  Weight: 213 lb 11.2 oz (96.9 kg)    Physical Exam Constitutional:      General: She is not in acute distress. HENT:     Head: Normocephalic and atraumatic.  Eyes:     General: No scleral icterus.    Pupils: Pupils are equal, round, and reactive to light.  Cardiovascular:     Rate and Rhythm: Normal rate and regular rhythm.     Heart sounds: Normal heart sounds.  Pulmonary:     Effort: Pulmonary effort is normal. No respiratory distress.     Breath sounds: No wheezing.  Abdominal:     General: Bowel sounds are normal. There is no distension.     Palpations: Abdomen is soft. There is no mass.     Tenderness: There is no abdominal tenderness.  Musculoskeletal:        General: No deformity. Normal range of motion.     Cervical back: Normal range of motion and neck supple.  Skin:    General: Skin is warm and dry.     Findings: No erythema or rash.  Neurological:     Mental Status: She is alert and oriented to person, place, and time. Mental status is at baseline.     Cranial Nerves: No cranial nerve deficit.     Coordination: Coordination normal.  Psychiatric:        Mood and Affect: Mood normal.        Behavior: Behavior normal.         Thought Content: Thought content normal.     CMP Latest Ref Rng & Units 02/24/2019  Glucose 70 - 99 mg/dL 116(H)  BUN 6 - 20 mg/dL 13  Creatinine 0.44 - 1.00 mg/dL 0.66  Sodium 135 - 145 mmol/L 140  Potassium 3.5 - 5.1 mmol/L 4.6  Chloride 98 - 111 mmol/L 107  CO2 22 - 32 mmol/L 26  Calcium 8.9 - 10.3 mg/dL 9.1  Total Protein  6.5 - 8.1 g/dL 7.3  Total Bilirubin 0.3 - 1.2 mg/dL 0.4  Alkaline Phos 38 - 126 U/L 73  AST 15 - 41 U/L 33  ALT 0 - 44 U/L 43   CBC Latest Ref Rng & Units 09/15/2019  WBC 4.0 - 10.5 K/uL 13.8(H)  Hemoglobin 12.0 - 15.0 g/dL 13.8  Hematocrit 36 - 46 % 40.4  Platelets 150 - 400 K/uL 332     No results found.  LABORATORY DATA:  I have reviewed the data as listed Lab Results  Component Value Date   WBC 13.8 (H) 09/15/2019   HGB 13.8 09/15/2019   HCT 40.4 09/15/2019   MCV 81.1 09/15/2019   PLT 332 09/15/2019   Recent Labs    10/04/18 0808 01/27/19 1509 02/24/19 1034  NA 139  --  140  K 4.2  --  4.6  CL 105  --  107  CO2 25  --  26  GLUCOSE 99  --  116*  BUN 8  --  13  CREATININE 0.61  --  0.66  CALCIUM 9.2  --  9.1  GFRNONAA  --   --  >60  GFRAA  --   --  >60  PROT 6.7   6.7 7.0 7.3  ALBUMIN 4.1   4.1 4.3 4.0  AST 66*   66* 34 33  ALT 76*   76* 58* 43  ALKPHOS 66   66 75 73  BILITOT 0.3   0.3 0.3 0.4  BILIDIR 0.1 0.1  --    Iron/TIBC/Ferritin/ %Sat No results found for: IRON, TIBC, FERRITIN, IRONPCTSAT      ASSESSMENT & PLAN:  1. Erythrocytosis   2. Neutrophilia    Labs are reviewed and discussed with patient. Chronic leukocytosis, predominantly neutrophilia.  Patient has had work-up done in the past including negative flow cytometry, normal LDH, normal smear. Discussed with patient that this is most likely reactive, secondary to chronic inflammation, i.e. she has been on chronic allergy shots.  She denies any constitutional symptoms.  Recommend continued observation. If she develops additional symptoms or concerns, we will  proceed with bone marrow biopsy at that point.  For now observation. I will check JAK2 mutation with reflex, ANA with reflex, CRP, ESR, at the next visit.  Erythrocytosis, resolved.   Orders Placed This Encounter  Procedures   CBC with Differential/Platelet    Standing Status:   Future    Standing Expiration Date:   09/14/2020   Comprehensive metabolic panel    Standing Status:   Future    Standing Expiration Date:   09/14/2020   JAK2 V617F, w Reflex to CALR/E12/MPL    Standing Status:   Future    Standing Expiration Date:   09/14/2020   ANA w/Reflex    Standing Status:   Future    Standing Expiration Date:   09/14/2020   C-reactive protein    Standing Status:   Future    Standing Expiration Date:   09/14/2020    All questions were answered. The patient knows to call the clinic with any problems questions or concerns.  Return of visit: 6 months Earlie Server, MD, PhD Hematology Oncology [facility] Pager- [phone] 09/15/2019

## 2019-09-15 NOTE — Progress Notes (Signed)
Patient denies new problems/concerns today.   °

## 2019-10-02 ENCOUNTER — Other Ambulatory Visit: Payer: Self-pay | Admitting: Primary Care

## 2019-10-02 DIAGNOSIS — Z3041 Encounter for surveillance of contraceptive pills: Secondary | ICD-10-CM

## 2019-10-03 NOTE — Telephone Encounter (Signed)
LOV: 10/11/18 Next OV: 10/17/19

## 2019-10-10 ENCOUNTER — Other Ambulatory Visit: Payer: BC Managed Care – PPO

## 2019-10-17 ENCOUNTER — Other Ambulatory Visit: Payer: Self-pay

## 2019-10-17 ENCOUNTER — Ambulatory Visit (INDEPENDENT_AMBULATORY_CARE_PROVIDER_SITE_OTHER): Payer: BC Managed Care – PPO | Admitting: Primary Care

## 2019-10-17 ENCOUNTER — Other Ambulatory Visit (HOSPITAL_COMMUNITY)
Admission: RE | Admit: 2019-10-17 | Discharge: 2019-10-17 | Disposition: A | Discharge status: Home / Self Care | Payer: BC Managed Care – PPO | Source: Ambulatory Visit | Attending: Primary Care | Admitting: Primary Care

## 2019-10-17 ENCOUNTER — Encounter: Payer: Self-pay | Admitting: Primary Care

## 2019-10-17 VITALS — BP 124/84 | HR 81 | Temp 95.7°F | Ht 63.5 in | Wt 216.0 lb

## 2019-10-17 DIAGNOSIS — J301 Allergic rhinitis due to pollen: Secondary | ICD-10-CM

## 2019-10-17 DIAGNOSIS — Z23 Encounter for immunization: Secondary | ICD-10-CM

## 2019-10-17 DIAGNOSIS — Z0000 Encounter for general adult medical examination without abnormal findings: Secondary | ICD-10-CM | POA: Diagnosis not present

## 2019-10-17 DIAGNOSIS — D72828 Other elevated white blood cell count: Secondary | ICD-10-CM

## 2019-10-17 DIAGNOSIS — Z124 Encounter for screening for malignant neoplasm of cervix: Secondary | ICD-10-CM | POA: Insufficient documentation

## 2019-10-17 DIAGNOSIS — Z3041 Encounter for surveillance of contraceptive pills: Secondary | ICD-10-CM

## 2019-10-17 DIAGNOSIS — F419 Anxiety disorder, unspecified: Secondary | ICD-10-CM

## 2019-10-17 DIAGNOSIS — R87810 Cervical high risk human papillomavirus (HPV) DNA test positive: Secondary | ICD-10-CM

## 2019-10-17 DIAGNOSIS — I10 Essential (primary) hypertension: Secondary | ICD-10-CM | POA: Diagnosis not present

## 2019-10-17 DIAGNOSIS — J4520 Mild intermittent asthma, uncomplicated: Secondary | ICD-10-CM

## 2019-10-17 DIAGNOSIS — G40909 Epilepsy, unspecified, not intractable, without status epilepticus: Secondary | ICD-10-CM

## 2019-10-17 DIAGNOSIS — D729 Disorder of white blood cells, unspecified: Secondary | ICD-10-CM

## 2019-10-17 LAB — LIPID PANEL
Cholesterol: 154 mg/dL (ref <200)
HDL: 35 mg/dL — ABNORMAL LOW (ref >=50)
LDL Cholesterol (Calc): 92 mg/dL (calc)
Non-HDL Cholesterol (Calc): 119 mg/dL (calc) (ref <130)
Total CHOL/HDL Ratio: 4.4 (calc) (ref <5.0)
Triglycerides: 174 mg/dL — ABNORMAL HIGH (ref <150)

## 2019-10-17 LAB — COMPREHENSIVE METABOLIC PANEL
AG Ratio: 1.8 (calc) (ref 1.0–2.5)
ALT: 19 U/L (ref 6–29)
AST: 16 U/L (ref 10–30)
Albumin: 4.3 g/dL (ref 3.6–5.1)
Alkaline phosphatase (APISO): 71 U/L (ref 31–125)
BUN: 11 mg/dL (ref 7–25)
CO2: 24 mmol/L (ref 20–32)
Calcium: 9.2 mg/dL (ref 8.6–10.2)
Chloride: 103 mmol/L (ref 98–110)
Creat: 0.63 mg/dL (ref 0.50–1.10)
Globulin: 2.4 g/dL (calc) (ref 1.9–3.7)
Glucose, Bld: 84 mg/dL (ref 65–99)
Potassium: 4.4 mmol/L (ref 3.5–5.3)
Sodium: 138 mmol/L (ref 135–146)
Total Bilirubin: 0.2 mg/dL (ref 0.2–1.2)
Total Protein: 6.7 g/dL (ref 6.1–8.1)

## 2019-10-17 MED ORDER — ALYACEN 7/7/7 0.5/0.75/1-35 MG-MCG PO TABS: 1.0000 | ORAL_TABLET | Freq: Every day | ORAL | 3 refills | Status: DC

## 2019-10-17 MED ORDER — DYMISTA 137-50 MCG/ACT NA SUSP: 1.0000 | Freq: Two times a day (BID) | NASAL | 0 refills | Status: DC | PRN

## 2019-10-17 MED ORDER — RAMIPRIL 10 MG PO CAPS: ORAL_CAPSULE | ORAL | 3 refills | Status: DC

## 2019-10-17 NOTE — Assessment & Plan Note (Signed)
Currently active, not well controlled given that she's been unable to get her injections. Continue Dymista, Allegra, Singulair. She will resume injections.

## 2019-10-17 NOTE — Assessment & Plan Note (Signed)
Infrequent use of albuterol inhaler, continue to monitor. No wheezing on exam.  

## 2019-10-17 NOTE — Assessment & Plan Note (Signed)
Tdap due, provided today. Pap smear due, completed today. Mammogram due in Fall 2021. Discussed the importance of a healthy diet and regular exercise in order for weight loss, and to reduce the risk of any potential medical problems.  Exam today unremarkable. Labs pending and also reviewed.

## 2019-10-17 NOTE — Addendum Note (Signed)
Addended by: Tawnya Crook on: 10/17/2019 04:41 PM   Modules accepted: Orders

## 2019-10-17 NOTE — Progress Notes (Signed)
Subjective:    Patient ID: Shari Armstrong, female    DOB: September 15, 1975, 44 y.o.   MRN: [id number]  HPI  This visit occurred during the SARS-CoV-2 public health emergency.  Safety protocols were in place, including screening questions prior to the visit, additional usage of staff PPE, and extensive cleaning of exam room while observing appropriate contact time as indicated for disinfecting solutions.   Shari Armstrong is a 44 year old female who presents today for complete physical.  Immunizations: -Tetanus: Completed in 2011 -Influenza: Due this season  -Covid-19: Completed series  Diet: She endorses a healthy diet. Exercise: She is not exercising.  Eye exam: Completed in 2020 Dental exam: Completes semi-annually   Pap Smear: Completed in 2018, due today Mammogram: Completed in October 2020 Hep C Screen: Due  BP Readings from Last 3 Encounters:  10/17/19 124/84  09/15/19 (!) 125/91  02/24/19 (!) 133/92      Review of Systems  Constitutional: Negative for unexpected weight change.  HENT: Negative for rhinorrhea.   Eyes: Negative for visual disturbance.  Respiratory: Negative for cough and shortness of breath.   Cardiovascular: Negative for chest pain.  Gastrointestinal: Negative for constipation and diarrhea.  Genitourinary: Negative for difficulty urinating and menstrual problem.  Musculoskeletal: Negative for arthralgias and myalgias.  Skin: Negative for rash.  Allergic/Immunologic: Positive for environmental allergies.  Neurological: Negative for dizziness, numbness and headaches.  Psychiatric/Behavioral: The patient is not nervous/anxious.        Past Medical History:  Diagnosis Date   Allergy    Hypertension    Seizures ([facility])    last seizure at 44 yo     Social History   Socioeconomic History   Marital status: Single    Spouse name: Not on file   Number of children: 0   Years of education: Not on file   Highest education level: Not on file    Occupational History    Employer: [employer]  Tobacco Use   Smoking status: Never Smoker   Smokeless tobacco: Never Used  Substance and Sexual Activity   Alcohol use: Yes    Comment: occasional   Drug use: No   Sexual activity: Not on file  Other Topics Concern   Not on file  Social History Narrative   Not on file   Social Determinants of Health   Financial Resource Strain:    Difficulty of Paying Living Expenses: Not on file  Food Insecurity:    Worried About Running Out of Food in the Last Year: Not on file   [employer] of Food in the Last Year: Not on file  Transportation Needs:    Lack of Transportation (Medical): Not on file   Lack of Transportation (Non-Medical): Not on file  Physical Activity:    Days of Exercise per Week: Not on file   Minutes of Exercise per Session: Not on file  Stress:    Feeling of Stress : Not on file  Social Connections:    Frequency of Communication with Friends and Family: Not on file   Frequency of Social Gatherings with Friends and Family: Not on file   Attends Religious Services: Not on file   Active Member of Clubs or Organizations: Not on file   Attends Banker Meetings: Not on file   Marital Status: Not on file  Intimate Partner Violence:    Fear of Current or Ex-Partner: Not on file   Emotionally Abused: Not on file   Physically Abused:  Not on file   Sexually Abused: Not on file    Past Surgical History:  Procedure Laterality Date   WISDOM TOOTH EXTRACTION     age 71    Family History  Problem Relation Age of Onset   Diabetes Mother        pre diabetic   Obesity Mother    Diabetes Father    Hypertension Father    Hyperlipidemia Father    ALS Father    Cancer Other        breast   Diabetes Maternal Grandmother    Diabetes Cousin     No Known Allergies  Current Outpatient Medications on File Prior to Visit  Medication Sig Dispense Refill   albuterol (PROAIR  HFA) 108 (90 Base) MCG/ACT inhaler Inhale 2 puffs EVERY 4 HOURS AS NEEDED FOR WHEEZING 8.5 g 0   fexofenadine (ALLEGRA) 180 MG tablet Take 180 mg by mouth daily.       Melatonin 1 MG TABS Take by mouth. prn     montelukast (SINGULAIR) 10 MG tablet      Multiple Vitamins-Minerals (WOMENS MULTI PO) Take by mouth daily.     No current facility-administered medications on file prior to visit.    BP 124/84    Pulse 81    Temp (!) 95.7 F (35.4 C) (Temporal)    Ht 5' 3.5" (1.613 m)    Wt 216 lb (98 kg)    LMP 10/03/2019    SpO2 98%    BMI 37.66 kg/m    Objective:   Physical Exam HENT:     Right Ear: Tympanic membrane and ear canal normal.     Left Ear: Tympanic membrane and ear canal normal.  Eyes:     Pupils: Pupils are equal, round, and reactive to light.  Cardiovascular:     Rate and Rhythm: Normal rate and regular rhythm.  Pulmonary:     Effort: Pulmonary effort is normal.     Breath sounds: Normal breath sounds.  Abdominal:     General: Bowel sounds are normal.     Palpations: Abdomen is soft.     Tenderness: There is no abdominal tenderness.  Genitourinary:    Labia:        Right: No tenderness or lesion.        Left: No tenderness or lesion.      Cervix: Discharge present. No cervical motion tenderness.     Uterus: Normal.      Adnexa: Right adnexa normal and left adnexa normal.     Comments: Scant amount of whitish discharge, no foul smell. Musculoskeletal:        General: Normal range of motion.     Cervical back: Neck supple.  Skin:    General: Skin is warm and dry.  Neurological:     Mental Status: She is alert and oriented to person, place, and time.     Cranial Nerves: No cranial nerve deficit.     Deep Tendon Reflexes:     Reflex Scores:      Patellar reflexes are 2+ on the right side and 2+ on the left side. Psychiatric:        Mood and Affect: Mood normal.            Assessment & Plan:

## 2019-10-17 NOTE — Assessment & Plan Note (Signed)
Denies concerns for anxiety. Continue to monitor.  

## 2019-10-17 NOTE — Assessment & Plan Note (Signed)
Repeat pap smear pending.

## 2019-10-17 NOTE — Assessment & Plan Note (Signed)
Doing well on OCP's, menstrual cycles regular. Refills provided. Pap smear completed.

## 2019-10-17 NOTE — Assessment & Plan Note (Signed)
No seizure activity

## 2019-10-17 NOTE — Patient Instructions (Addendum)
Stop by the lab prior to leaving today. I will notify you of your results once received.   Start exercising. You should be getting 150 minutes of moderate intensity exercise weekly.  Continue to work on a healthy diet. Ensure you are consuming 64 ounces of water daily.  It was a pleasure to see you today!     Preventive Care 8-43 Years Old, Female Preventive care refers to visits with your health care provider and lifestyle choices that can promote health and wellness. This includes:  A yearly physical exam. This may also be called an annual well check.  Regular dental visits and eye exams.  Immunizations.  Screening for certain conditions.  Healthy lifestyle choices, such as eating a healthy diet, getting regular exercise, not using drugs or products that contain nicotine and tobacco, and limiting alcohol use. What can I expect for my preventive care visit? Physical exam Your health care provider will check your:  Height and weight. This may be used to calculate body mass index (BMI), which tells if you are at a healthy weight.  Heart rate and blood pressure.  Skin for abnormal spots. Counseling Your health care provider may ask you questions about your:  Alcohol, tobacco, and drug use.  Emotional well-being.  Home and relationship well-being.  Sexual activity.  Eating habits.  Work and work Statistician.  Method of birth control.  Menstrual cycle.  Pregnancy history. What immunizations do I need?  Influenza (flu) vaccine  This is recommended every year. Tetanus, diphtheria, and pertussis (Tdap) vaccine  You may need a Td booster every 10 years. Varicella (chickenpox) vaccine  You may need this if you have not been vaccinated. Zoster (shingles) vaccine  You may need this after age 14. Measles, mumps, and rubella (MMR) vaccine  You may need at least one dose of MMR if you were born in 1957 or later. You may also need a second dose. Pneumococcal  conjugate (PCV13) vaccine  You may need this if you have certain conditions and were not previously vaccinated. Pneumococcal polysaccharide (PPSV23) vaccine  You may need one or two doses if you smoke cigarettes or if you have certain conditions. Meningococcal conjugate (MenACWY) vaccine  You may need this if you have certain conditions. Hepatitis A vaccine  You may need this if you have certain conditions or if you travel or work in places where you may be exposed to hepatitis A. Hepatitis B vaccine  You may need this if you have certain conditions or if you travel or work in places where you may be exposed to hepatitis B. Haemophilus influenzae type b (Hib) vaccine  You may need this if you have certain conditions. Human papillomavirus (HPV) vaccine  If recommended by your health care provider, you may need three doses over 6 months. You may receive vaccines as individual doses or as more than one vaccine together in one shot (combination vaccines). Talk with your health care provider about the risks and benefits of combination vaccines. What tests do I need? Blood tests  Lipid and cholesterol levels. These may be checked every 5 years, or more frequently if you are over 37 years old.  Hepatitis C test.  Hepatitis B test. Screening  Lung cancer screening. You may have this screening every year starting at age 78 if you have a 30-pack-year history of smoking and currently smoke or have quit within the past 15 years.  Colorectal cancer screening. All adults should have this screening starting at age 63 and  continuing until age 9. Your health care provider may recommend screening at age 30 if you are at increased risk. You will have tests every 1-10 years, depending on your results and the type of screening test.  Diabetes screening. This is done by checking your blood sugar (glucose) after you have not eaten for a while (fasting). You may have this done every 1-3  years.  Mammogram. This may be done every 1-2 years. Talk with your health care provider about when you should start having regular mammograms. This may depend on whether you have a family history of breast cancer.  BRCA-related cancer screening. This may be done if you have a family history of breast, ovarian, tubal, or peritoneal cancers.  Pelvic exam and Pap test. This may be done every 3 years starting at age 21. Starting at age 62, this may be done every 5 years if you have a Pap test in combination with an HPV test. Other tests  Sexually transmitted disease (STD) testing.  Bone density scan. This is done to screen for osteoporosis. You may have this scan if you are at high risk for osteoporosis. Follow these instructions at home: Eating and drinking  Eat a diet that includes fresh fruits and vegetables, whole grains, lean protein, and low-fat dairy.  Take vitamin and mineral supplements as recommended by your health care provider.  Do not drink alcohol if: ? Your health care provider tells you not to drink. ? You are pregnant, may be pregnant, or are planning to become pregnant.  If you drink alcohol: ? Limit how much you have to 0-1 drink a day. ? Be aware of how much alcohol is in your drink. In the [place] one drink equals one 12 oz bottle of beer (355 mL), one 5 oz glass of wine (148 mL), or one 1 oz glass of hard liquor (44 mL). Lifestyle  Take daily care of your teeth and gums.  Stay active. Exercise for at least 30 minutes on 5 or more days each week.  Do not use any products that contain nicotine or tobacco, such as cigarettes, e-cigarettes, and chewing tobacco. If you need help quitting, ask your health care provider.  If you are sexually active, practice safe sex. Use a condom or other form of birth control (contraception) in order to prevent pregnancy and STIs (sexually transmitted infections).  If told by your health care provider, take low-dose aspirin daily  starting at age 71. What's next?  Visit your health care provider once a year for a well check visit.  Ask your health care provider how often you should have your eyes and teeth checked.  Stay up to date on all vaccines. This information is not intended to replace advice given to you by your health care provider. Make sure you discuss any questions you have with your health care provider. Document Revised: 10/25/2017 Document Reviewed: 10/25/2017 Elsevier Patient Education  2020 [employer].

## 2019-10-17 NOTE — Assessment & Plan Note (Signed)
Well controlled in the office today, continue Ramipril. CMP pending.

## 2019-10-17 NOTE — Assessment & Plan Note (Signed)
Following with hematology, last visit in July 2021. Plan is to monitor, I reviewed her recent labs.

## 2019-10-21 LAB — CYTOLOGY - PAP
Comment: NEGATIVE
Diagnosis: NEGATIVE
High risk HPV: NEGATIVE

## 2019-11-14 ENCOUNTER — Other Ambulatory Visit: Payer: Self-pay

## 2019-11-14 DIAGNOSIS — Z1231 Encounter for screening mammogram for malignant neoplasm of breast: Secondary | ICD-10-CM

## 2019-11-14 NOTE — Progress Notes (Signed)
Screening

## 2019-12-15 ENCOUNTER — Ambulatory Visit
Admission: RE | Admit: 2019-12-15 | Discharge: 2019-12-15 | Disposition: A | Discharge status: Home / Self Care | Payer: BC Managed Care – PPO | Source: Ambulatory Visit | Attending: Primary Care | Admitting: Primary Care

## 2019-12-15 ENCOUNTER — Other Ambulatory Visit: Payer: Self-pay

## 2019-12-15 DIAGNOSIS — Z1231 Encounter for screening mammogram for malignant neoplasm of breast: Secondary | ICD-10-CM | POA: Insufficient documentation

## 2020-03-15 ENCOUNTER — Telehealth: Payer: Self-pay | Admitting: Oncology

## 2020-03-15 ENCOUNTER — Inpatient Hospital Stay: Payer: BC Managed Care – PPO

## 2020-03-15 NOTE — Telephone Encounter (Signed)
Patient called to cancel lab appt 1/17 today and md appointment on 1/24.  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  She will call back the end of the week to reschedule.

## 2020-03-22 ENCOUNTER — Inpatient Hospital Stay: Payer: BC Managed Care – PPO | Admitting: Oncology

## 2020-11-05 ENCOUNTER — Encounter: Payer: BC Managed Care – PPO | Admitting: Primary Care

## 2020-11-22 ENCOUNTER — Other Ambulatory Visit: Payer: Self-pay | Admitting: Primary Care

## 2020-11-22 DIAGNOSIS — Z3041 Encounter for surveillance of contraceptive pills: Secondary | ICD-10-CM

## 2020-11-25 ENCOUNTER — Other Ambulatory Visit: Payer: Self-pay

## 2020-11-25 ENCOUNTER — Encounter: Payer: Self-pay | Admitting: Primary Care

## 2020-11-25 ENCOUNTER — Ambulatory Visit (INDEPENDENT_AMBULATORY_CARE_PROVIDER_SITE_OTHER): Payer: BC Managed Care – PPO | Admitting: Primary Care

## 2020-11-25 VITALS — BP 124/72 | HR 88 | Temp 97.6°F | Ht 65.3 in | Wt 183.0 lb

## 2020-11-25 DIAGNOSIS — Z0000 Encounter for general adult medical examination without abnormal findings: Secondary | ICD-10-CM

## 2020-11-25 DIAGNOSIS — R87810 Cervical high risk human papillomavirus (HPV) DNA test positive: Secondary | ICD-10-CM

## 2020-11-25 DIAGNOSIS — J4520 Mild intermittent asthma, uncomplicated: Secondary | ICD-10-CM

## 2020-11-25 DIAGNOSIS — Z114 Encounter for screening for human immunodeficiency virus [HIV]: Secondary | ICD-10-CM

## 2020-11-25 DIAGNOSIS — I10 Essential (primary) hypertension: Secondary | ICD-10-CM

## 2020-11-25 DIAGNOSIS — Z1159 Encounter for screening for other viral diseases: Secondary | ICD-10-CM

## 2020-11-25 DIAGNOSIS — Z1231 Encounter for screening mammogram for malignant neoplasm of breast: Secondary | ICD-10-CM

## 2020-11-25 DIAGNOSIS — G40909 Epilepsy, unspecified, not intractable, without status epilepticus: Secondary | ICD-10-CM

## 2020-11-25 DIAGNOSIS — Z3041 Encounter for surveillance of contraceptive pills: Secondary | ICD-10-CM

## 2020-11-25 DIAGNOSIS — Z23 Encounter for immunization: Secondary | ICD-10-CM

## 2020-11-25 DIAGNOSIS — D729 Disorder of white blood cells, unspecified: Secondary | ICD-10-CM

## 2020-11-25 MED ORDER — NORGESTIMATE-ETH ESTRADIOL 0.25-35 MG-MCG PO TABS: 1.0000 | ORAL_TABLET | Freq: Every day | ORAL | 3 refills | Status: DC

## 2020-11-25 NOTE — Assessment & Plan Note (Signed)
Influenza vaccine provided today. Other vaccines UTD.  Pap smear UTD.  Discussed the importance of a healthy diet and regular exercise in order for weight loss, and to reduce the risk of further co-morbidity.  Exam today stable. Labs pending.

## 2020-11-25 NOTE — Assessment & Plan Note (Signed)
No seizure activity in years.

## 2020-11-25 NOTE — Assessment & Plan Note (Signed)
Controlled, infrequent use of albuterol inhaler. Continue Singulair 10 mg nightly and Dymista PRN.   No wheezing on exam

## 2020-11-25 NOTE — Assessment & Plan Note (Signed)
Repeat labs pending. No longer seeing hematology, plan is to monitor.

## 2020-11-25 NOTE — Assessment & Plan Note (Signed)
Bothersome dysmenorrhea for 1-2 days during menses, chronic.   Discussed options. Will change form tri-phasic OCP's to biphasic to see if this helps. Rx for Sprintec sent to pharmacy. Discussed instructions for starting.   She will update.

## 2020-11-25 NOTE — Assessment & Plan Note (Signed)
Pap smear from 2021 negative.  Continue to monitor.

## 2020-11-25 NOTE — Assessment & Plan Note (Signed)
Well controlled in the office today, continue ramipril 10 mg daily. CMP pending.

## 2020-11-25 NOTE — Patient Instructions (Signed)
Stop by the lab prior to leaving today. I will notify you of your results once received.   Start your new birth control pills when you are supposed to start your new pill pack.   It was a pleasure to see you today!  Preventive Care 44-44 Years Old, Female Preventive care refers to lifestyle choices and visits with your health care provider that can promote health and wellness. This includes: A yearly physical exam. This is also called an annual wellness visit. Regular dental and eye exams. Immunizations. Screening for certain conditions. Healthy lifestyle choices, such as: Eating a healthy diet. Getting regular exercise. Not using drugs or products that contain nicotine and tobacco. Limiting alcohol use. What can I expect for my preventive care visit? Physical exam Your health care provider will check your: Height and weight. These may be used to calculate your BMI (body mass index). BMI is a measurement that tells if you are at a healthy weight. Heart rate and blood pressure. Body temperature. Skin for abnormal spots. Counseling Your health care provider may ask you questions about your: Past medical problems. Family's medical history. Alcohol, tobacco, and drug use. Emotional well-being. Home life and relationship well-being. Sexual activity. Diet, exercise, and sleep habits. Work and work Statistician. Access to firearms. Method of birth control. Menstrual cycle. Pregnancy history. What immunizations do I need? Vaccines are usually given at various ages, according to a schedule. Your health care provider will recommend vaccines for you based on your age, medical history, and lifestyle or other factors, such as travel or where you work. What tests do I need? Blood tests Lipid and cholesterol levels. These may be checked every 5 years, or more often if you are over 64 years old. Hepatitis C test. Hepatitis B test. Screening Lung cancer screening. You may have this  screening every year starting at age 44 if you have a 30-pack-year history of smoking and currently smoke or have quit within the past 15 years. Colorectal cancer screening. All adults should have this screening starting at age 44 and continuing until age 44. Your health care provider may recommend screening at age 44 if you are at increased risk. You will have tests every 1-10 years, depending on your results and the type of screening test. Diabetes screening. This is done by checking your blood sugar (glucose) after you have not eaten for a while (fasting). You may have this done every 1-3 years. Mammogram. This may be done every 1-2 years. Talk with your health care provider about when you should start having regular mammograms. This may depend on whether you have a family history of breast cancer. BRCA-related cancer screening. This may be done if you have a family history of breast, ovarian, tubal, or peritoneal cancers. Pelvic exam and Pap test. This may be done every 3 years starting at age 44. Starting at age 27, this may be done every 5 years if you have a Pap test in combination with an HPV test. Other tests STD (sexually transmitted disease) testing, if you are at risk. Bone density scan. This is done to screen for osteoporosis. You may have this scan if you are at high risk for osteoporosis. Talk with your health care provider about your test results, treatment options, and if necessary, the need for more tests. Follow these instructions at home: Eating and drinking  Eat a diet that includes fresh fruits and vegetables, whole grains, lean protein, and low-fat dairy products. Take vitamin and mineral supplements as recommended  by your health care provider. Do not drink alcohol if: Your health care provider tells you not to drink. You are pregnant, may be pregnant, or are planning to become pregnant. If you drink alcohol: Limit how much you have to 0-1 drink a day. Be aware of  how much alcohol is in your drink. In the [place] one drink equals one 12 oz bottle of beer (355 mL), one 5 oz glass of wine (148 mL), or one 1 oz glass of hard liquor (44 mL). Lifestyle Take daily care of your teeth and gums. Brush your teeth every morning and night with fluoride toothpaste. Floss one time each day. Stay active. Exercise for at least 30 minutes 5 or more days each week. Do not use any products that contain nicotine or tobacco, such as cigarettes, e-cigarettes, and chewing tobacco. If you need help quitting, ask your health care provider. Do not use drugs. If you are sexually active, practice safe sex. Use a condom or other form of protection to prevent STIs (sexually transmitted infections). If you do not wish to become pregnant, use a form of birth control. If you plan to become pregnant, see your health care provider for a prepregnancy visit. If told by your health care provider, take low-dose aspirin daily starting at age 44. Find healthy ways to cope with stress, such as: Meditation, yoga, or listening to music. Journaling. Talking to a trusted person. Spending time with friends and family. Safety Always wear your seat belt while driving or riding in a vehicle. Do not drive: If you have been drinking alcohol. Do not ride with someone who has been drinking. When you are tired or distracted. While texting. Wear a helmet and other protective equipment during sports activities. If you have firearms in your house, make sure you follow all gun safety procedures. What's next? Visit your health care provider once a year for an annual wellness visit. Ask your health care provider how often you should have your eyes and teeth checked. Stay up to date on all vaccines. This information is not intended to replace advice given to you by your health care provider. Make sure you discuss any questions you have with your health care provider. Document Revised: 04/23/2020 Document  Reviewed: 10/25/2017 Elsevier Patient Education  2022 [employer].

## 2020-11-25 NOTE — Progress Notes (Signed)
Subjective:    Patient ID: Shari Armstrong, female    DOB: 09-03-1975, 45 y.o.   MRN: [id number]  HPI  Shari Armstrong is a very pleasant 45 y.o. female who presents today for complete physical and follow up of chronic conditions.  Immunizations: -Tetanus: 2021 -Influenza: Due today -Covid-19: 3 vaccines   Diet: Fair diet.  Exercise: No regular exercise.  Eye exam: Completes annually  Dental exam: Completes semi-annually   Pap Smear: Completed in 2021 Mammogram: Completed in October 2021  BP Readings from Last 3 Encounters:  11/25/20 124/72  10/17/19 124/84  09/15/19 (!) 125/91       Review of Systems  Constitutional:  Negative for unexpected weight change.  HENT:  Negative for rhinorrhea.   Respiratory:  Negative for cough and shortness of breath.   Cardiovascular:  Negative for chest pain.  Gastrointestinal:  Negative for constipation and diarrhea.  Genitourinary:  Positive for menstrual problem. Negative for difficulty urinating.       Dysmenorrhea during first 1-2 days of menses  Musculoskeletal:  Negative for arthralgias and myalgias.  Skin:  Negative for rash.  Allergic/Immunologic: Negative for environmental allergies.  Neurological:  Negative for dizziness, numbness and headaches.  Psychiatric/Behavioral:  The patient is not nervous/anxious.         Past Medical History:  Diagnosis Date   Allergy    Hypertension    Seizures (HCC)    last seizure at 45 yo    Social History   Socioeconomic History   Marital status: Single    Spouse name: Not on file   Number of children: 0   Years of education: Not on file   Highest education level: Not on file  Occupational History    Employer: [employer]  Tobacco Use   Smoking status: Never   Smokeless tobacco: Never  Substance and Sexual Activity   Alcohol use: Yes    Comment: occasional   Drug use: No   Sexual activity: Not on file  Other Topics Concern   Not on file  Social History Narrative    Not on file   Social Determinants of Health   Financial Resource Strain: Not on file  Food Insecurity: Not on file  Transportation Needs: Not on file  Physical Activity: Not on file  Stress: Not on file  Social Connections: Not on file  Intimate Partner Violence: Not on file    Past Surgical History:  Procedure Laterality Date   WISDOM TOOTH EXTRACTION     age 89    Family History  Problem Relation Age of Onset   Diabetes Mother        pre diabetic   Obesity Mother    Diabetes Father    Hypertension Father    Hyperlipidemia Father    ALS Father    Cancer Other        breast   Breast cancer Other    Diabetes Maternal Grandmother    Diabetes Cousin     No Known Allergies  Current Outpatient Medications on File Prior to Visit  Medication Sig Dispense Refill   albuterol (PROAIR HFA) 108 (90 Base) MCG/ACT inhaler Inhale 2 puffs EVERY 4 HOURS AS NEEDED FOR WHEEZING 8.5 g 0   DYMISTA 137-50 MCG/ACT SUSP Place 1 spray into the nose 2 (two) times daily as needed. 23 g 0   fexofenadine (ALLEGRA) 180 MG tablet Take 180 mg by mouth daily.       Melatonin 1 MG TABS Take  by mouth. prn     montelukast (SINGULAIR) 10 MG tablet      Multiple Vitamins-Minerals (WOMENS MULTI PO) Take by mouth daily.     norethindrone-ethinyl estradiol (ALYACEN 7/7/7) 0.5/0.75/1-35 MG-MCG tablet Take 1 tablet by mouth daily. 84 tablet 3   ramipril (ALTACE) 10 MG capsule TAKE 1 CAPSULE BY MOUTH EVERY DAY FOR BLOOD PRESSURE 90 capsule 3   No current facility-administered medications on file prior to visit.    BP 124/72   Pulse 88   Temp 97.6 F (36.4 C) (Temporal)   Ht 5' 5.3" (1.659 m)   Wt 183 lb (83 kg)   SpO2 100%   BMI 30.17 kg/m  Objective:   Physical Exam HENT:     Right Ear: Tympanic membrane and ear canal normal.     Left Ear: Tympanic membrane and ear canal normal.     Nose: Nose normal.  Eyes:     Conjunctiva/sclera: Conjunctivae normal.     Pupils: Pupils are equal, round,  and reactive to light.  Neck:     Thyroid: No thyromegaly.  Cardiovascular:     Rate and Rhythm: Normal rate and regular rhythm.     Heart sounds: No murmur heard. Pulmonary:     Effort: Pulmonary effort is normal.     Breath sounds: Normal breath sounds. No rales.  Abdominal:     General: Bowel sounds are normal.     Palpations: Abdomen is soft.     Tenderness: There is no abdominal tenderness.  Musculoskeletal:        General: Normal range of motion.     Cervical back: Neck supple.  Lymphadenopathy:     Cervical: No cervical adenopathy.  Skin:    General: Skin is warm and dry.     Findings: No rash.  Neurological:     Mental Status: She is alert and oriented to person, place, and time.     Cranial Nerves: No cranial nerve deficit.     Deep Tendon Reflexes: Reflexes are normal and symmetric.  Psychiatric:        Mood and Affect: Mood normal.          Assessment & Plan:      This visit occurred during the SARS-CoV-2 public health emergency.  Safety protocols were in place, including screening questions prior to the visit, additional usage of staff PPE, and extensive cleaning of exam room while observing appropriate contact time as indicated for disinfecting solutions.

## 2020-11-26 ENCOUNTER — Other Ambulatory Visit: Payer: Self-pay | Admitting: Primary Care

## 2020-11-26 DIAGNOSIS — Z3041 Encounter for surveillance of contraceptive pills: Secondary | ICD-10-CM

## 2020-11-26 LAB — LIPID PANEL
Cholesterol: 149 mg/dL (ref 0–200)
HDL: 32.2 mg/dL — ABNORMAL LOW (ref >39.00)
LDL Cholesterol: 77 mg/dL (ref 0–99)
NonHDL: 116.51
Total CHOL/HDL Ratio: 5
Triglycerides: 199 mg/dL — ABNORMAL HIGH (ref 0.0–149.0)
VLDL: 39.8 mg/dL (ref 0.0–40.0)

## 2020-11-26 LAB — COMPREHENSIVE METABOLIC PANEL
ALT: 34 U/L (ref 0–35)
AST: 18 U/L (ref 0–37)
Albumin: 4.2 g/dL (ref 3.5–5.2)
Alkaline Phosphatase: 67 U/L (ref 39–117)
BUN: 17 mg/dL (ref 6–23)
CO2: 25 mEq/L (ref 19–32)
Calcium: 9.5 mg/dL (ref 8.4–10.5)
Chloride: 104 mEq/L (ref 96–112)
Creatinine, Ser: 0.63 mg/dL (ref 0.40–1.20)
GFR: 107.97 mL/min (ref >60.00)
Glucose, Bld: 95 mg/dL (ref 70–99)
Potassium: 4.2 mEq/L (ref 3.5–5.1)
Sodium: 139 mEq/L (ref 135–145)
Total Bilirubin: 0.3 mg/dL (ref 0.2–1.2)
Total Protein: 6.9 g/dL (ref 6.0–8.3)

## 2020-11-26 LAB — CBC
HCT: 42.7 % (ref 36.0–46.0)
Hemoglobin: 14.2 g/dL (ref 12.0–15.0)
MCHC: 33.2 g/dL (ref 30.0–36.0)
MCV: 82.2 fl (ref 78.0–100.0)
Platelets: 314 10*3/uL (ref 150.0–400.0)
RBC: 5.19 Mil/uL — ABNORMAL HIGH (ref 3.87–5.11)
RDW: 13.7 % (ref 11.5–15.5)
WBC: 11 10*3/uL — ABNORMAL HIGH (ref 4.0–10.5)

## 2020-11-26 LAB — HEPATITIS C ANTIBODY
Hepatitis C Ab: NONREACTIVE
SIGNAL TO CUT-OFF: 0 (ref <1.00)

## 2020-11-26 LAB — HIV ANTIBODY (ROUTINE TESTING W REFLEX): HIV 1&2 Ab, 4th Generation: NONREACTIVE

## 2020-11-27 ENCOUNTER — Other Ambulatory Visit: Payer: Self-pay | Admitting: Primary Care

## 2020-11-27 DIAGNOSIS — Z3041 Encounter for surveillance of contraceptive pills: Secondary | ICD-10-CM

## 2020-11-29 DIAGNOSIS — Z23 Encounter for immunization: Secondary | ICD-10-CM

## 2020-12-03 ENCOUNTER — Other Ambulatory Visit: Payer: Self-pay | Admitting: Primary Care

## 2020-12-03 DIAGNOSIS — I10 Essential (primary) hypertension: Secondary | ICD-10-CM

## 2021-08-03 ENCOUNTER — Other Ambulatory Visit: Payer: Self-pay | Admitting: Primary Care

## 2021-08-03 DIAGNOSIS — Z3041 Encounter for surveillance of contraceptive pills: Secondary | ICD-10-CM

## 2021-08-22 ENCOUNTER — Ambulatory Visit: Payer: BC Managed Care – PPO | Admitting: Family

## 2021-08-22 ENCOUNTER — Encounter: Payer: Self-pay | Admitting: Family

## 2021-08-22 VITALS — BP 122/90 | HR 90 | Temp 98.1°F | Resp 16 | Ht 65.3 in | Wt 169.1 lb

## 2021-08-22 DIAGNOSIS — J301 Allergic rhinitis due to pollen: Secondary | ICD-10-CM | POA: Diagnosis not present

## 2021-08-22 DIAGNOSIS — J00 Acute nasopharyngitis [common cold]: Secondary | ICD-10-CM | POA: Insufficient documentation

## 2021-08-22 MED ORDER — AMOXICILLIN-POT CLAVULANATE 875-125 MG PO TABS
1.0000 | ORAL_TABLET | Freq: Two times a day (BID) | ORAL | 0 refills | Status: DC
Start: 2021-08-22 — End: 2021-11-09

## 2021-09-06 ENCOUNTER — Other Ambulatory Visit: Payer: Self-pay | Admitting: Primary Care

## 2021-09-06 DIAGNOSIS — I10 Essential (primary) hypertension: Secondary | ICD-10-CM

## 2021-10-18 ENCOUNTER — Other Ambulatory Visit: Payer: Self-pay | Admitting: Primary Care

## 2021-10-18 DIAGNOSIS — Z3041 Encounter for surveillance of contraceptive pills: Secondary | ICD-10-CM

## 2021-10-18 NOTE — Telephone Encounter (Signed)
Patient needs CPE for ongoing refills of her birth control.  Please schedule and notify me when she has been scheduled.  Last CPE was 11/25/2020.

## 2021-10-19 NOTE — Telephone Encounter (Signed)
LVM for patient to call and schedule

## 2021-10-21 NOTE — Telephone Encounter (Signed)
Left message to return call to our office.  Sent my chart as well  

## 2021-10-25 NOTE — Telephone Encounter (Signed)
Patient scheduled for a CPE 11/29/21

## 2021-11-09 ENCOUNTER — Encounter: Payer: Self-pay | Admitting: Family Medicine

## 2021-11-09 ENCOUNTER — Ambulatory Visit: Payer: BC Managed Care – PPO | Admitting: Family Medicine

## 2021-11-09 VITALS — BP 122/74 | HR 84 | Temp 97.6°F | Ht 63.5 in | Wt 173.0 lb

## 2021-11-09 DIAGNOSIS — J040 Acute laryngitis: Secondary | ICD-10-CM | POA: Diagnosis not present

## 2021-11-09 DIAGNOSIS — J029 Acute pharyngitis, unspecified: Secondary | ICD-10-CM | POA: Diagnosis not present

## 2021-11-09 HISTORY — DX: Acute laryngitis: J04.0

## 2021-11-09 LAB — POCT RAPID STREP A (OFFICE): Rapid Strep A Screen: NEGATIVE

## 2021-11-09 NOTE — Progress Notes (Signed)
Subjective:    Patient ID: Shari Armstrong, female    DOB: [birth year], 46 y.o.   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   Wt Readings from Last 3 Encounters:  11/09/21 173 lb (78.5 kg)  08/22/21 169 lb 2 oz (76.7 kg)  11/25/20 183 lb (83 kg)   30.16 kg/m  ST started end of last week  Worse over the weekend   Scratchy and rough feeling  Back of throat  Lost voice No fever   Very runny nose  Lot of pnd  Has allergies also   Cough- is fairly mild  Dry /not productive   Some headache with weather changes   She teaches school  Took a covid test- negative    Otc  Day quil Nyquil  Occ aleve   Results for orders placed or performed in visit on 11/09/21  Rapid Strep A  Result Value Ref Range   Rapid Strep A Screen Negative Negative     Patient Active Problem List   Diagnosis Date Noted   Laryngitis 11/09/2021   Acute nasopharyngitis 08/22/2021   Neutrophilia 10/17/2019   Elevated LFTs 10/11/2018   Dyshidrotic eczema 10/09/2017   Anxiety 05/01/2017   Cervical high risk human papillomavirus (HPV) DNA test positive 09/11/2016   Seizure disorder (HCC) 03/25/2007   Essential hypertension 03/20/2007   ALLERGIC RHINITIS, SEASONAL 03/20/2007   Asthma 03/20/2007   Past Medical History:  Diagnosis Date   Allergy    Hypertension    Seizures (HCC)    last seizure at 46 yo   Past Surgical History:  Procedure Laterality Date   WISDOM TOOTH EXTRACTION     age 41   Social History   Tobacco Use   Smoking status: Never   Smokeless tobacco: Never  Substance Use Topics   Alcohol use: Yes    Comment: occasional   Drug use: No   Family History  Problem Relation Age of Onset   Diabetes Mother        pre diabetic   Obesity Mother    Diabetes Father    Hypertension Father    Hyperlipidemia Father    ALS Father    Cancer Other        breast   Breast cancer Other    Diabetes Maternal Grandmother    Diabetes Cousin    No Known  Allergies Current Outpatient Medications on File Prior to Visit  Medication Sig Dispense Refill   albuterol (PROAIR HFA) 108 (90 Base) MCG/ACT inhaler Inhale 2 puffs EVERY 4 HOURS AS NEEDED FOR WHEEZING 8.5 g 0   DYMISTA 137-50 MCG/ACT SUSP Place 1 spray into the nose 2 (two) times daily as needed. 23 g 0   fexofenadine (ALLEGRA) 180 MG tablet Take 180 mg by mouth daily.       Melatonin 1 MG TABS Take by mouth. prn     montelukast (SINGULAIR) 10 MG tablet      Multiple Vitamins-Minerals (WOMENS MULTI PO) Take by mouth daily.     norgestimate-ethinyl estradiol (SPRINTEC 28) 0.25-35 MG-MCG tablet Take 1 tablet by mouth daily. Office visit required for further refills. 84 tablet 0   ramipril (ALTACE) 10 MG capsule TAKE 1 CAPSULE BY MOUTH ONCE DAILY FOR BLOOD PRESSURE 90 capsule 3   No current facility-administered medications on file prior to visit.    Review of Systems  Constitutional:  Positive for fatigue. Negative for activity change, appetite change, fever and unexpected  weight change.  HENT:  Positive for congestion, postnasal drip, rhinorrhea, sore throat and voice change. Negative for ear pain, sinus pressure, sinus pain and trouble swallowing.   Eyes:  Negative for pain, redness and visual disturbance.  Respiratory:  Positive for cough. Negative for choking, chest tightness, shortness of breath, wheezing and stridor.   Cardiovascular:  Negative for chest pain and palpitations.  Gastrointestinal:  Negative for abdominal pain, blood in stool, constipation and diarrhea.  Endocrine: Negative for polydipsia and polyuria.  Genitourinary:  Negative for dysuria, frequency and urgency.  Musculoskeletal:  Negative for arthralgias, back pain and myalgias.  Skin:  Negative for pallor and rash.  Allergic/Immunologic: Negative for environmental allergies.  Neurological:  Positive for headaches. Negative for dizziness and syncope.  Hematological:  Negative for adenopathy. Does not bruise/bleed  easily.  Psychiatric/Behavioral:  Negative for decreased concentration and dysphoric mood. The patient is not nervous/anxious.        Objective:   Physical Exam Constitutional:      General: She is not in acute distress.    Appearance: Normal appearance. She is well-developed. She is obese. She is not ill-appearing, toxic-appearing or diaphoretic.  HENT:     Head: Normocephalic and atraumatic.     Comments: Nares are injected and congested    No sinus tenderness    Right Ear: Tympanic membrane, ear canal and external ear normal.     Left Ear: Tympanic membrane, ear canal and external ear normal.     Nose: Congestion and rhinorrhea present.     Mouth/Throat:     Mouth: Mucous membranes are moist.     Pharynx: Oropharynx is clear. No oropharyngeal exudate or posterior oropharyngeal erythema.     Comments: Clear pnd   Throat is not erythematous  Voice is very hoarse Eyes:     General:        Right eye: No discharge.        Left eye: No discharge.     Conjunctiva/sclera: Conjunctivae normal.     Pupils: Pupils are equal, round, and reactive to light.  Cardiovascular:     Rate and Rhythm: Normal rate.     Heart sounds: Normal heart sounds.  Pulmonary:     Effort: Pulmonary effort is normal. No respiratory distress.     Breath sounds: Normal breath sounds. No stridor. No wheezing, rhonchi or rales.     Comments: Good air exch No rales, rhonchi or wheeze  Chest:     Chest wall: No tenderness.  Musculoskeletal:     Cervical back: Normal range of motion and neck supple.  Lymphadenopathy:     Cervical: No cervical adenopathy.  Skin:    General: Skin is warm and dry.     Capillary Refill: Capillary refill takes less than 2 seconds.     Findings: No rash.  Neurological:     Mental Status: She is alert.     Cranial Nerves: No cranial nerve deficit.  Psychiatric:        Mood and Affect: Mood normal.           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

## 2021-11-09 NOTE — Assessment & Plan Note (Signed)
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

## 2021-11-09 NOTE — Patient Instructions (Addendum)
Drink fluids and rest  mucinex DM is good for cough and congestion  Nasal saline for congestion as needed  Tylenol or advil/aleve for fever or pain or headache (sore throat) Salt water gargle  Chloraseptic products  Try to rest you voice  Please alert [place] if symptoms worsen (if severe or short of breath please go to the ER)   If fever- let us know  Watch for wheezing also (may need prednisone)   Strep test is negative today

## 2021-11-29 ENCOUNTER — Ambulatory Visit (INDEPENDENT_AMBULATORY_CARE_PROVIDER_SITE_OTHER): Payer: BC Managed Care – PPO | Admitting: Primary Care

## 2021-11-29 ENCOUNTER — Encounter: Payer: Self-pay | Admitting: Primary Care

## 2021-11-29 VITALS — BP 144/90 | HR 90 | Temp 97.0°F | Ht 63.25 in | Wt 178.0 lb

## 2021-11-29 DIAGNOSIS — Z0000 Encounter for general adult medical examination without abnormal findings: Secondary | ICD-10-CM

## 2021-11-29 DIAGNOSIS — J4520 Mild intermittent asthma, uncomplicated: Secondary | ICD-10-CM | POA: Diagnosis not present

## 2021-11-29 DIAGNOSIS — J301 Allergic rhinitis due to pollen: Secondary | ICD-10-CM

## 2021-11-29 DIAGNOSIS — Z1211 Encounter for screening for malignant neoplasm of colon: Secondary | ICD-10-CM

## 2021-11-29 DIAGNOSIS — G40909 Epilepsy, unspecified, not intractable, without status epilepticus: Secondary | ICD-10-CM | POA: Diagnosis not present

## 2021-11-29 DIAGNOSIS — I10 Essential (primary) hypertension: Secondary | ICD-10-CM

## 2021-11-29 DIAGNOSIS — Z23 Encounter for immunization: Secondary | ICD-10-CM

## 2021-11-29 DIAGNOSIS — Z3041 Encounter for surveillance of contraceptive pills: Secondary | ICD-10-CM

## 2021-11-29 DIAGNOSIS — Z1231 Encounter for screening mammogram for malignant neoplasm of breast: Secondary | ICD-10-CM

## 2021-11-29 NOTE — Progress Notes (Signed)
Subjective:    Patient ID: Shari Armstrong, female    DOB: 01/04/1976, 46 y.o.   MRN: [id number]  HPI  Shari Armstrong is a very pleasant 46 y.o. female who presents today for complete physical and follow up of chronic conditions.  Immunizations: -Tetanus: 2021 -Influenza: Due today  Diet: Healthy diet.  Exercise: No regular exercise, some walking.  Eye exam: Completes annually  Dental exam: Completes semi-annually   Pap Smear: Completed in 2021 Mammogram: Completed in October 2021  Colonoscopy: Never completed  BP Readings from Last 3 Encounters:  11/29/21 (!) 144/90  11/09/21 122/74  08/22/21 122/90    Wt Readings from Last 3 Encounters:  11/29/21 178 lb (80.7 kg)  11/09/21 173 lb (78.5 kg)  08/22/21 169 lb 2 oz (76.7 kg)         Review of Systems  Constitutional:  Negative for unexpected weight change.  HENT:  Negative for rhinorrhea.   Respiratory:  Negative for cough and shortness of breath.   Cardiovascular:  Negative for chest pain.  Gastrointestinal:  Negative for constipation and diarrhea.  Genitourinary:  Negative for difficulty urinating and menstrual problem.  Musculoskeletal:  Negative for arthralgias and myalgias.  Skin:  Negative for rash.  Allergic/Immunologic: Positive for environmental allergies.  Neurological:  Positive for headaches. Negative for dizziness.  Psychiatric/Behavioral:  The patient is not nervous/anxious.          Past Medical History:  Diagnosis Date   Allergy    Elevated LFTs 10/11/2018   Hypertension    Laryngitis 11/09/2021   Seizures (HCC)    last seizure at 46 yo    Social History   Socioeconomic History   Marital status: Single    Spouse name: Not on file   Number of children: 0   Years of education: Not on file   Highest education level: Not on file  Occupational History    Employer: [employer]  Tobacco Use   Smoking status: Never   Smokeless tobacco: Never  Substance and Sexual Activity    Alcohol use: Yes    Comment: occasional   Drug use: No   Sexual activity: Not on file  Other Topics Concern   Not on file  Social History Narrative   Not on file   Social Determinants of Health   Financial Resource Strain: Not on file  Food Insecurity: Not on file  Transportation Needs: Not on file  Physical Activity: Not on file  Stress: Not on file  Social Connections: Not on file  Intimate Partner Violence: Not on file    Past Surgical History:  Procedure Laterality Date   WISDOM TOOTH EXTRACTION     age 89    Family History  Problem Relation Age of Onset   Diabetes Mother        pre diabetic   Obesity Mother    Diabetes Father    Hypertension Father    Hyperlipidemia Father    ALS Father    Cancer Other        breast   Breast cancer Other    Diabetes Maternal Grandmother    Diabetes Cousin     No Known Allergies  Current Outpatient Medications on File Prior to Visit  Medication Sig Dispense Refill   albuterol (PROAIR HFA) 108 (90 Base) MCG/ACT inhaler Inhale 2 puffs EVERY 4 HOURS AS NEEDED FOR WHEEZING 8.5 g 0   DYMISTA 137-50 MCG/ACT SUSP Place 1 spray into the nose 2 (two) times daily  as needed. 23 g 0   fexofenadine (ALLEGRA) 180 MG tablet Take 180 mg by mouth daily.       Melatonin 1 MG TABS Take by mouth. prn     montelukast (SINGULAIR) 10 MG tablet      Multiple Vitamins-Minerals (WOMENS MULTI PO) Take by mouth daily.     norgestimate-ethinyl estradiol (SPRINTEC 28) 0.25-35 MG-MCG tablet Take 1 tablet by mouth daily. Office visit required for further refills. 84 tablet 0   ramipril (ALTACE) 10 MG capsule TAKE 1 CAPSULE BY MOUTH ONCE DAILY FOR BLOOD PRESSURE 90 capsule 3   No current facility-administered medications on file prior to visit.    BP (!) 144/90   Pulse 90   Temp (!) 97 F (36.1 C) (Temporal)   Ht 5' 3.25" (1.607 m)   Wt 178 lb (80.7 kg)   LMP 11/27/2021 (Exact Date)   SpO2 100%   BMI 31.28 kg/m  Objective:   Physical  Exam HENT:     Right Ear: Tympanic membrane and ear canal normal.     Left Ear: Tympanic membrane and ear canal normal.     Nose: Nose normal.  Eyes:     Conjunctiva/sclera: Conjunctivae normal.     Pupils: Pupils are equal, round, and reactive to light.  Neck:     Thyroid: No thyromegaly.  Cardiovascular:     Rate and Rhythm: Normal rate and regular rhythm.     Heart sounds: No murmur heard. Pulmonary:     Effort: Pulmonary effort is normal.     Breath sounds: Normal breath sounds. No rales.  Abdominal:     General: Bowel sounds are normal.     Palpations: Abdomen is soft.     Tenderness: There is no abdominal tenderness.  Musculoskeletal:        General: Normal range of motion.     Cervical back: Neck supple.  Lymphadenopathy:     Cervical: No cervical adenopathy.  Skin:    General: Skin is warm and dry.     Findings: No rash.  Neurological:     Mental Status: She is alert and oriented to person, place, and time.     Cranial Nerves: No cranial nerve deficit.     Deep Tendon Reflexes: Reflexes are normal and symmetric.  Psychiatric:        Mood and Affect: Mood normal.           Assessment & Plan:   Problem List Items Addressed This Visit       Cardiovascular and Mediastinum   Essential hypertension    Above goal today, improved on recheck. Recommended she start monitoring BP at home and report if readings are consistently at or above 130/90.  Continue ramipril 10 mg daily. CMP pending.      Relevant Orders   Lipid panel   Comprehensive metabolic panel     Respiratory   ALLERGIC RHINITIS, SEASONAL    Waxes and wanes.  Following with allergist.  Continue Singulair 10 mg HS, Allegra 180 mg daily, Dymista 137-50 mcg PRN.      Asthma    Controlled.  Continue albuterol inhaler for which she uses sparingly. Continue to monitor.         Nervous and Auditory   Seizure disorder (HCC)    No seizure in years.  Continue off treatment.  Continue to  monitor.         Other   Preventative health care - Primary    Immunizations UTD.  Influenza vaccine provided today. Pap smear UTD Mammogram due, orders placed. Colonoscopy due, referral placed to GI.  Discussed the importance of a healthy diet and regular exercise in order for weight loss, and to reduce the risk of further co-morbidity.  Exam stable. Labs pending.  Follow up in 1 year for repeat physical.       Encounter for birth control pills maintenance    Doing well on OCP's. Continue Ortho-Cyclen 0.25-35 mg-mcg.  Pap smear UTD, due 2024.      Other Visit Diagnoses     Encounter for screening mammogram for malignant neoplasm of breast       Relevant Orders   MM 3D SCREEN BREAST BILATERAL   Screening for colon cancer       Relevant Orders   Ambulatory referral to Gastroenterology          Pleas Koch, NP

## 2021-11-29 NOTE — Assessment & Plan Note (Signed)
Doing well on OCP's. Continue Ortho-Cyclen 0.25-35 mg-mcg.  Pap smear UTD, due 2024.

## 2021-11-29 NOTE — Assessment & Plan Note (Signed)
Above goal today, improved on recheck. Recommended she start monitoring BP at home and report if readings are consistently at or above 130/90.  Continue ramipril 10 mg daily. CMP pending.

## 2021-11-29 NOTE — Assessment & Plan Note (Signed)
Controlled.  Continue albuterol inhaler for which she uses sparingly. Continue to monitor.

## 2021-11-29 NOTE — Assessment & Plan Note (Signed)
Immunizations UTD. Influenza vaccine provided today. Pap smear UTD. Mammogram due, orders placed. Colonoscopy due, referral placed to GI.  Discussed the importance of a healthy diet and regular exercise in order for weight loss, and to reduce the risk of further co-morbidity.  Exam stable. Labs pending.  Follow up in 1 year for repeat physical.  

## 2021-11-29 NOTE — Assessment & Plan Note (Signed)
Waxes and wanes.  Following with allergist.  Continue Singulair 10 mg HS, Allegra 180 mg daily, Dymista 137-50 mcg PRN.

## 2021-11-29 NOTE — Assessment & Plan Note (Signed)
No seizure in years.  Continue off treatment.  Continue to monitor.

## 2021-11-29 NOTE — Patient Instructions (Signed)
Stop by the lab prior to leaving today. I will notify you of your results once received.  ? ?[facility] your [facility].  ? ?You will be contacted regarding your referral to GI for the colonoscopy.  Please let us know if you have not been contacted within two weeks.  ? ?It was a pleasure to see you today! ? ?Preventive Care 40-45 Years Old, Female ?Preventive care refers to lifestyle choices and visits with your health care provider that can promote health and wellness. Preventive care visits are also called wellness exams. ?What can I expect for my preventive care visit? ?Counseling ?Your health care provider may ask you questions about your: ?Medical history, including: ?Past medical problems. ?Family medical history. ?Pregnancy history. ?Current health, including: ?Menstrual cycle. ?Method of birth control. ?Emotional well-being. ?Home life and relationship well-being. ?Sexual activity and sexual health. ?Lifestyle, including: ?Alcohol, nicotine or tobacco, and drug use. ?Access to firearms. ?Diet, exercise, and sleep habits. ?Work and work environment. ?Sunscreen use. ?Safety issues such as seatbelt and bike helmet use. ?Physical exam ?Your health care provider will check your: ?Height and weight. These may be used to calculate your BMI (body mass index). BMI is a measurement that tells if you are at a healthy weight. ?Waist circumference. This measures the distance around your waistline. This measurement also tells if you are at a healthy weight and may help predict your risk of certain diseases, such as type 2 diabetes and high blood pressure. ?Heart rate and blood pressure. ?Body temperature. ?Skin for abnormal spots. ?What immunizations do I need? ? ?Vaccines are usually given at various ages, according to a schedule. Your health care provider will recommend vaccines for you based on your age, medical history, and lifestyle or other factors, such as travel or where you work. ?What  tests do I need? ?Screening ?Your health care provider may recommend screening tests for certain conditions. This may include: ?Lipid and cholesterol levels. ?Diabetes screening. This is done by checking your blood sugar (glucose) after you have not eaten for a while (fasting). ?Pelvic exam and Pap test. ?Hepatitis B test. ?Hepatitis C test. ?HIV (human immunodeficiency virus) test. ?STI (sexually transmitted infection) testing, if you are at risk. ?Lung cancer screening. ?Colorectal cancer screening. ?Mammogram. Talk with your health care provider about when you should start having regular mammograms. This may depend on whether you have a family history of breast cancer. ?BRCA-related cancer screening. This may be done if you have a family history of breast, ovarian, tubal, or peritoneal cancers. ?Bone density scan. This is done to screen for osteoporosis. ?Talk with your health care provider about your test results, treatment options, and if necessary, the need for more tests. ?Follow these instructions at home: ?Eating and drinking ? ?Eat a diet that includes fresh fruits and vegetables, whole grains, lean protein, and low-fat dairy products. ?Take vitamin and mineral supplements as recommended by your health care provider. ?Do not drink alcohol if: ?Your health care provider tells you not to drink. ?You are pregnant, may be pregnant, or are planning to become pregnant. ?If you drink alcohol: ?Limit how much you have to 0-1 drink a day. ?Know how much alcohol is in your drink. In the [place] one drink equals one 12 oz bottle of beer (355 mL), one 5 oz glass of wine (148 mL), or one 1? oz glass of hard liquor (44 mL). ?Lifestyle ?Brush your teeth every morning and night with fluoride toothpaste. Floss one time each   day. ?Exercise for at least 30 minutes 5 or more days each week. ?Do not use any products that contain nicotine or tobacco. These products include cigarettes, chewing tobacco, and vaping devices, such as  e-cigarettes. If you need help quitting, ask your health care provider. ?Do not use drugs. ?If you are sexually active, practice safe sex. Use a condom or other form of protection to prevent STIs. ?If you do not wish to become pregnant, use a form of birth control. If you plan to become pregnant, see your health care provider for a prepregnancy visit. ?Take aspirin only as told by your health care provider. Make sure that you understand how much to take and what form to take. Work with your health care provider to find out whether it is safe and beneficial for you to take aspirin daily. ?Find healthy ways to manage stress, such as: ?Meditation, yoga, or listening to music. ?Journaling. ?Talking to a trusted person. ?Spending time with friends and family. ?Minimize exposure to UV radiation to reduce your risk of skin cancer. ?Safety ?Always wear your seat belt while driving or riding in a vehicle. ?Do not drive: ?If you have been drinking alcohol. Do not ride with someone who has been drinking. ?When you are tired or distracted. ?While texting. ?If you have been using any mind-altering substances or drugs. ?Wear a helmet and other protective equipment during sports activities. ?If you have firearms in your house, make sure you follow all gun safety procedures. ?Seek help if you have been physically or sexually abused. ?What's next? ?Visit your health care provider once a year for an annual wellness visit. ?Ask your health care provider how often you should have your eyes and teeth checked. ?Stay up to date on all vaccines. ?This information is not intended to replace advice given to you by your health care provider. Make sure you discuss any questions you have with your health care provider. ?Document Revised: 08/11/2020 Document Reviewed: 08/11/2020 ?Elsevier Patient Education ? 2023 Elsevier Inc. ? ?

## 2021-11-30 LAB — COMPREHENSIVE METABOLIC PANEL
ALT: 17 U/L (ref 0–35)
AST: 15 U/L (ref 0–37)
Albumin: 4.1 g/dL (ref 3.5–5.2)
Alkaline Phosphatase: 60 U/L (ref 39–117)
BUN: 21 mg/dL (ref 6–23)
CO2: 27 mEq/L (ref 19–32)
Calcium: 9 mg/dL (ref 8.4–10.5)
Chloride: 104 mEq/L (ref 96–112)
Creatinine, Ser: 0.61 mg/dL (ref 0.40–1.20)
GFR: 108.04 mL/min (ref >60.00)
Glucose, Bld: 93 mg/dL (ref 70–99)
Potassium: 4.1 mEq/L (ref 3.5–5.1)
Sodium: 138 mEq/L (ref 135–145)
Total Bilirubin: 0.2 mg/dL (ref 0.2–1.2)
Total Protein: 6.8 g/dL (ref 6.0–8.3)

## 2021-11-30 LAB — LIPID PANEL
Cholesterol: 168 mg/dL (ref 0–200)
HDL: 50.1 mg/dL (ref >39.00)
LDL Cholesterol: 87 mg/dL (ref 0–99)
NonHDL: 117.78
Total CHOL/HDL Ratio: 3
Triglycerides: 154 mg/dL — ABNORMAL HIGH (ref 0.0–149.0)
VLDL: 30.8 mg/dL (ref 0.0–40.0)

## 2021-12-02 ENCOUNTER — Other Ambulatory Visit: Payer: Self-pay | Admitting: Primary Care

## 2021-12-02 DIAGNOSIS — I10 Essential (primary) hypertension: Secondary | ICD-10-CM

## 2021-12-05 ENCOUNTER — Other Ambulatory Visit: Payer: Self-pay

## 2021-12-05 ENCOUNTER — Encounter: Payer: Self-pay | Admitting: Gastroenterology

## 2021-12-05 ENCOUNTER — Telehealth: Payer: Self-pay

## 2021-12-05 DIAGNOSIS — Z1211 Encounter for screening for malignant neoplasm of colon: Secondary | ICD-10-CM

## 2021-12-05 MED ORDER — NA SULFATE-K SULFATE-MG SULF 17.5-3.13-1.6 GM/177ML PO SOLN: 1.0000 | Freq: Once | ORAL | 0 refills | Status: AC

## 2021-12-05 NOTE — Telephone Encounter (Signed)
Gastroenterology Pre-Procedure Review  Request Date: 01/06/22 Requesting Physician: Dr. Allen Norris  PATIENT REVIEW QUESTIONS: The patient responded to the following health history questions as indicated:    1. Are you having any GI issues? no 2. Do you have a personal history of Polyps? no 3. Do you have a family history of Colon Cancer or Polyps? no 4. Diabetes Mellitus? no 5. Joint replacements in the past 12 months?no 6. Major health problems in the past 3 months?no 7. Any artificial heart valves, MVP, or defibrillator?no    MEDICATIONS & ALLERGIES:    Patient reports the following regarding taking any anticoagulation/antiplatelet therapy:   Plavix, Coumadin, Eliquis, Xarelto, Lovenox, Pradaxa, Brilinta, or Effient? no Aspirin? no  Patient confirms/reports the following medications:  Current Outpatient Medications  Medication Sig Dispense Refill   albuterol (PROAIR HFA) 108 (90 Base) MCG/ACT inhaler Inhale 2 puffs EVERY 4 HOURS AS NEEDED FOR WHEEZING 8.5 g 0   DYMISTA 137-50 MCG/ACT SUSP Place 1 spray into the nose 2 (two) times daily as needed. 23 g 0   fexofenadine (ALLEGRA) 180 MG tablet Take 180 mg by mouth daily.       Melatonin 1 MG TABS Take by mouth. prn     montelukast (SINGULAIR) 10 MG tablet      Multiple Vitamins-Minerals (WOMENS MULTI PO) Take by mouth daily.     norgestimate-ethinyl estradiol (SPRINTEC 28) 0.25-35 MG-MCG tablet Take 1 tablet by mouth daily. Office visit required for further refills. 84 tablet 0   ramipril (ALTACE) 10 MG capsule TAKE 1 CAPSULE BY MOUTH ONCE DAILY FOR BLOOD PRESSURE 90 capsule 3   No current facility-administered medications for this visit.    Patient confirms/reports the following allergies:  No Known Allergies  No orders of the defined types were placed in this encounter.   AUTHORIZATION INFORMATION Primary Insurance: 1D#: Group #:  Secondary Insurance: 1D#: Group #:  SCHEDULE INFORMATION: Date: 01/06/22 Time: Location:  [place]

## 2022-01-02 ENCOUNTER — Encounter: Payer: Self-pay | Admitting: Gastroenterology

## 2022-01-07 IMAGING — MG DIGITAL SCREENING BILAT W/ TOMO W/ CAD
6 of 10 series · 6 of 30 positions shown · non-contrast
Comparison: Previous exam(s).

CLINICAL DATA: Screening.

EXAM:
DIGITAL SCREENING BILATERAL MAMMOGRAM WITH TOMO AND CAD

[L MLO synth-2D (1 of 2)]
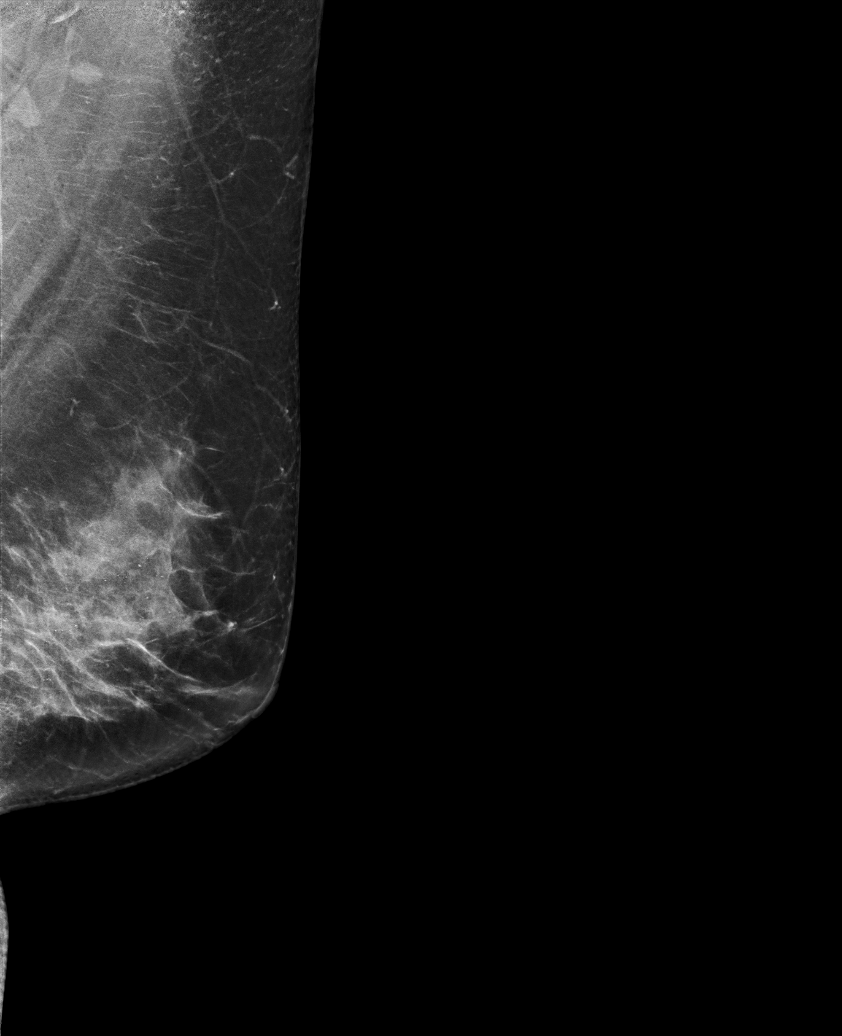

[R CC synth-2D]
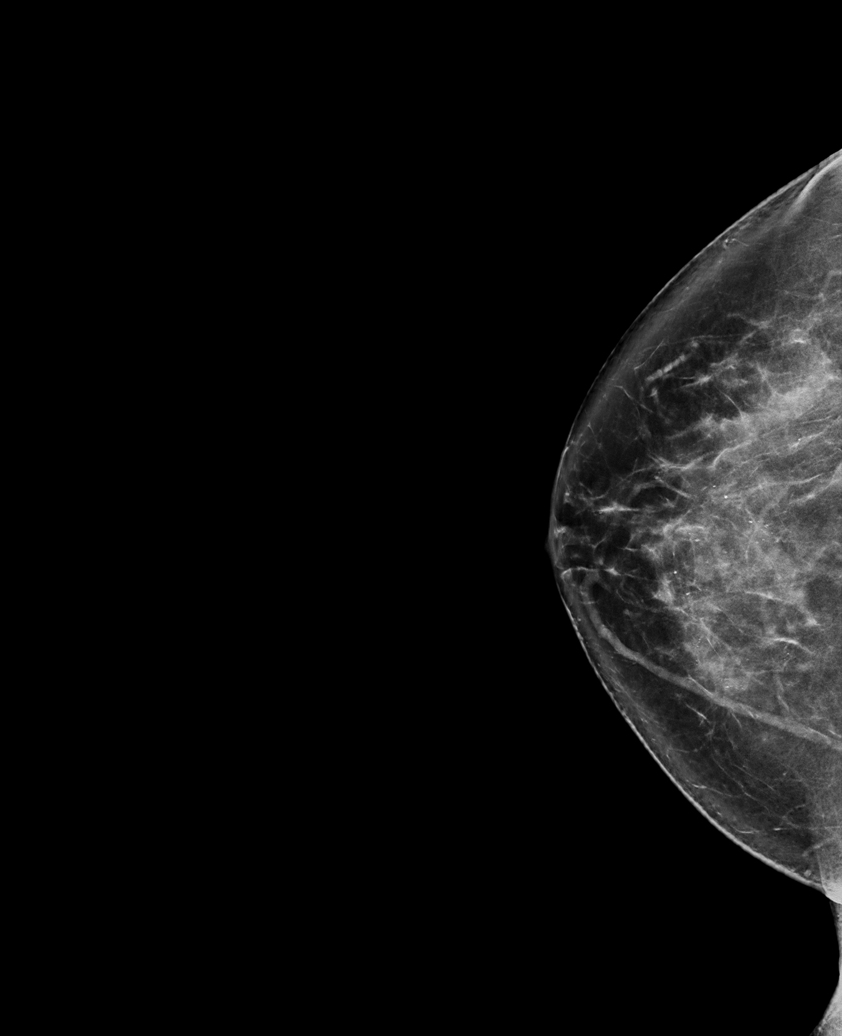

[L MLO synth-2D (2 of 2)]
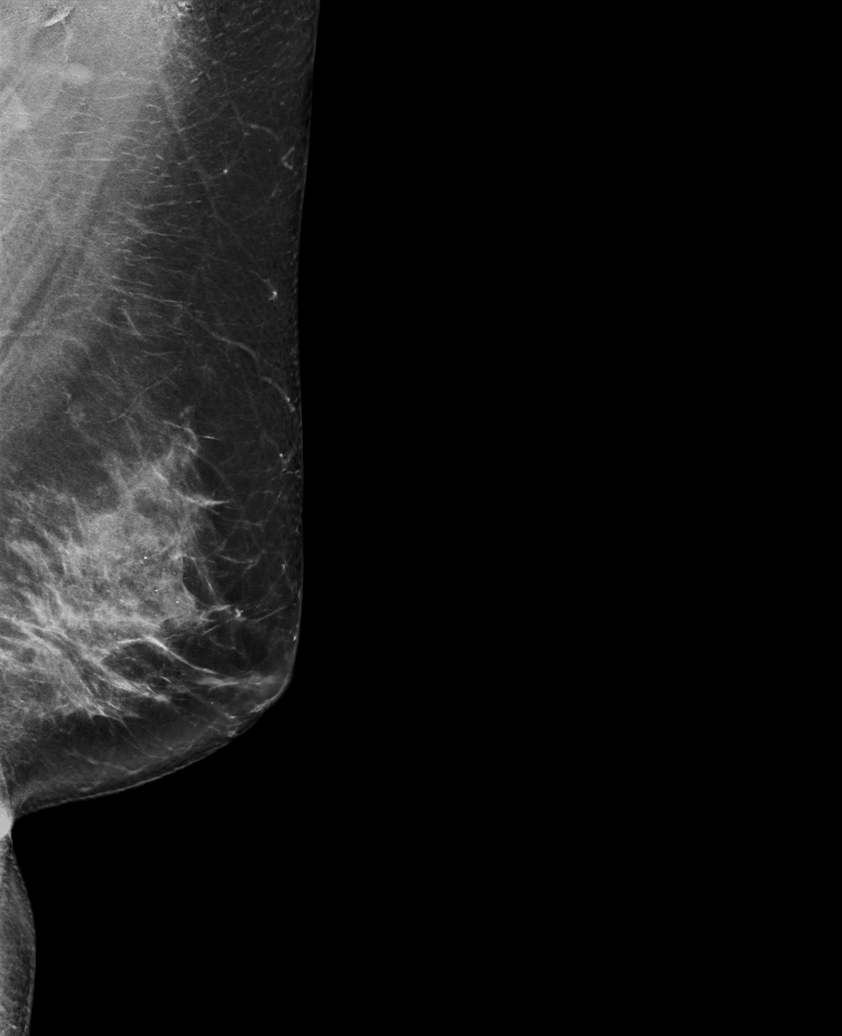

[L CC synth-2D]
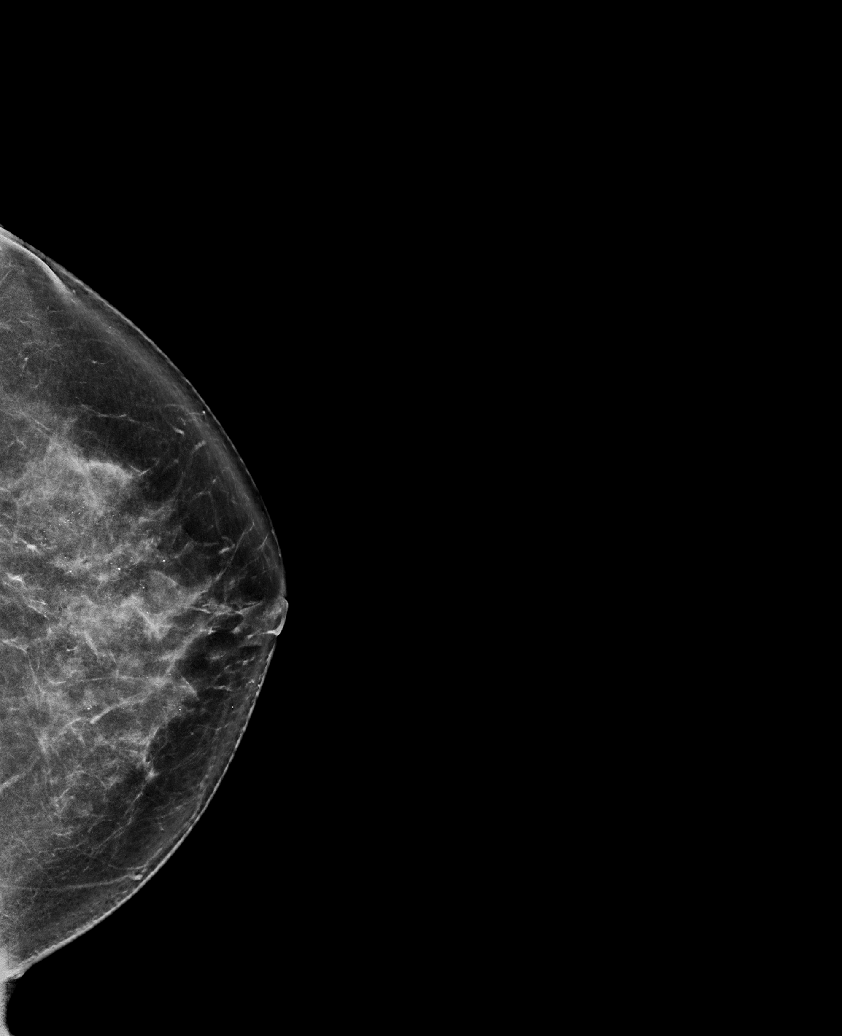

[R MLO synth-2D]
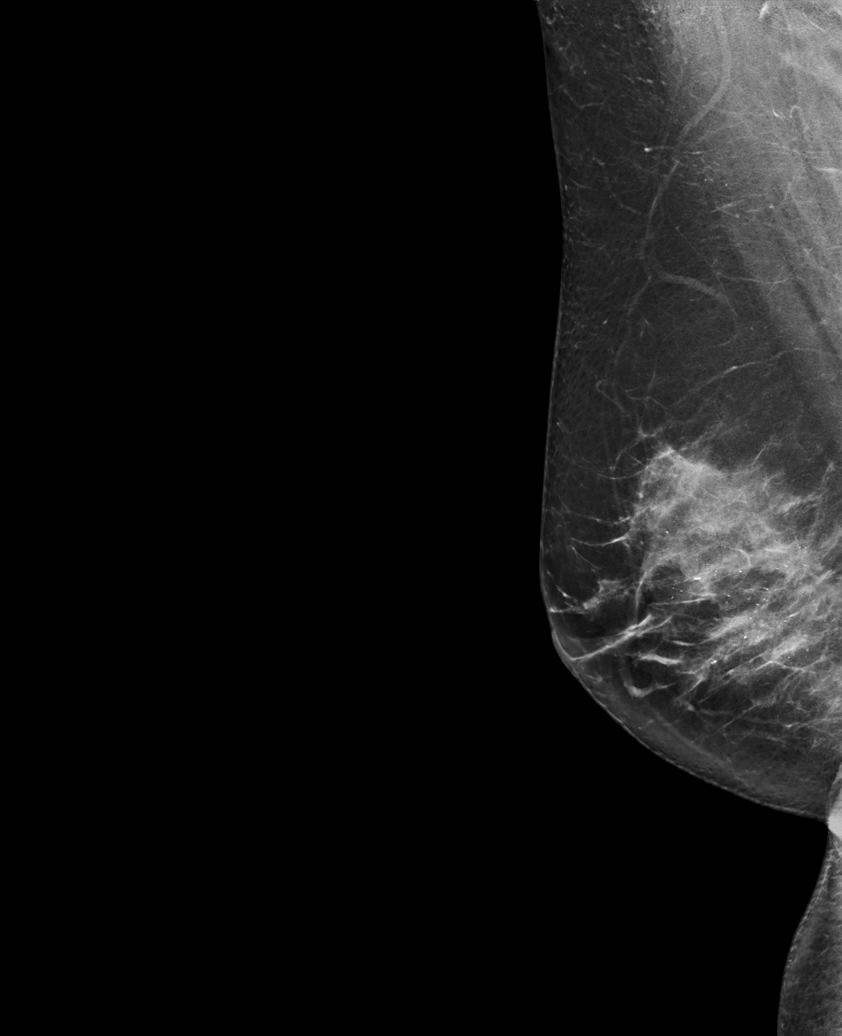

[L CC tomo · tomo slice 42/83.0]
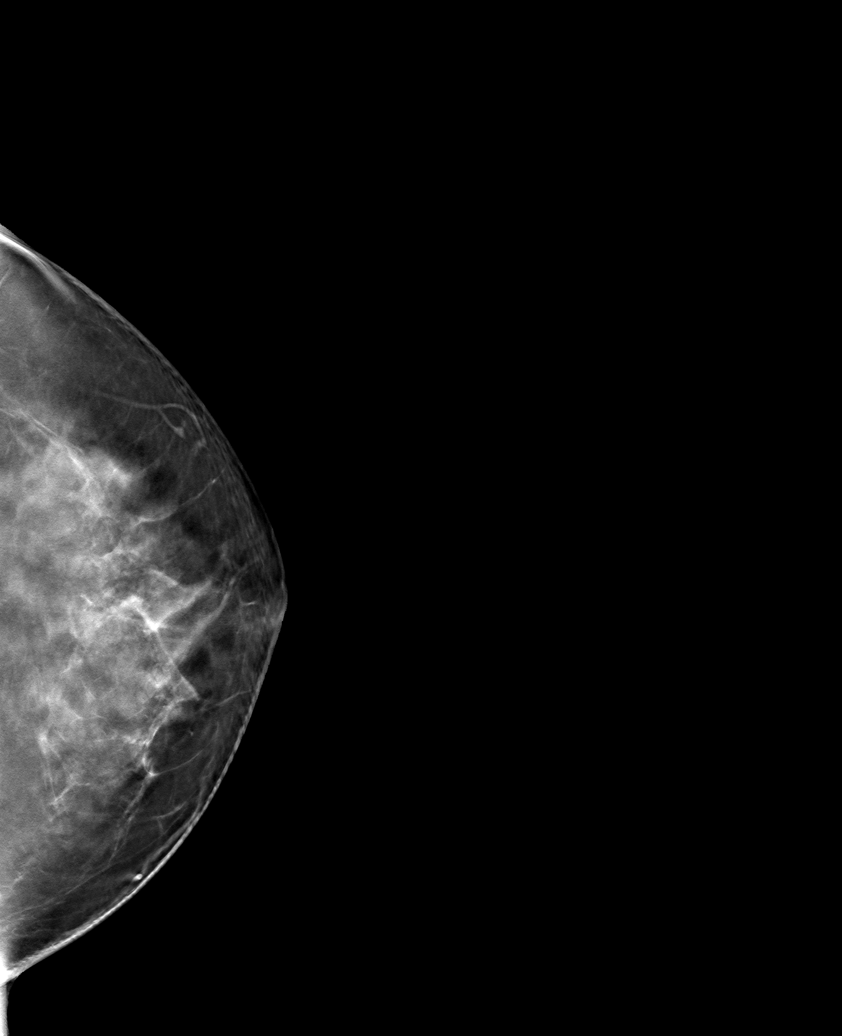

[6 of 30 positions shown; findings below may reference images not displayed]

ACR Breast Density Category c: The breast tissue is heterogeneously
dense, which may obscure small masses.
FINDINGS: There are no findings suspicious for malignancy. Images were
processed with CAD.
IMPRESSION: No mammographic evidence of malignancy. A result letter of this
screening mammogram will be mailed directly to the patient.

RECOMMENDATION:
Screening mammogram in one year. (Code:[id number]-U-LHB)

BI-RADS CATEGORY  1: Negative.

## 2022-01-18 ENCOUNTER — Other Ambulatory Visit: Payer: Self-pay | Admitting: Primary Care

## 2022-01-18 DIAGNOSIS — Z3041 Encounter for surveillance of contraceptive pills: Secondary | ICD-10-CM

## 2022-02-13 ENCOUNTER — Encounter: Payer: Self-pay | Admitting: Gastroenterology

## 2022-02-17 ENCOUNTER — Ambulatory Visit: Payer: BC Managed Care – PPO | Admitting: Primary Care

## 2022-02-17 VITALS — BP 142/86 | HR 90 | Temp 97.1°F | Ht 63.25 in | Wt 181.0 lb

## 2022-02-17 DIAGNOSIS — R051 Acute cough: Secondary | ICD-10-CM | POA: Insufficient documentation

## 2022-02-17 LAB — POC COVID19 BINAXNOW: SARS Coronavirus 2 Ag: NEGATIVE

## 2022-02-17 MED ORDER — AZITHROMYCIN 250 MG PO TABS: ORAL_TABLET | ORAL | 0 refills | Status: DC

## 2022-02-17 MED ORDER — PREDNISONE 20 MG PO TABS: ORAL_TABLET | ORAL | 0 refills | Status: DC

## 2022-02-17 NOTE — Progress Notes (Signed)
Subjective:    Patient ID: Shari Armstrong, female    DOB: 06-27-1975, 46 y.o.   MRN: [id number]  HPI  Shari Armstrong is a very pleasant 46 y.o. female with a history of hypertension, seizure disorder, asthma who presents today to discuss cough.   Symptom onset two weeks ago with nasal congestion. She then developed cough. Last night she developed body aches and chills. She feels "run down". She's developed chest tightness and some wheezing.   She denies fevers. She's had to use her albuterol inhaler twice daily for the last week. She's feeling better than she did one week ago, but about the same as she was earlier this week.   She took an at home Covid-19 test 11 days ago which was negative. She completed her flu shot this season.  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   She is managed on ramipril, has never experienced ACE-I induced cough. Her cough has been deeper within her chest, not throat.   Review of Systems  Constitutional:  Positive for chills and fatigue. Negative for fever.  HENT:  Positive for congestion and sore throat.   Respiratory:  Positive for cough, chest tightness and shortness of breath.          Past Medical History:  Diagnosis Date   Allergy    Asthma    exercise/allergy induced   Elevated LFTs 10/11/2018   Hypertension    Laryngitis 11/09/2021   Seizures (HCC)    last seizure at 46 yo.  meds until age 4.   Wears contact lenses     Social History   Socioeconomic History   Marital status: Single    Spouse name: Not on file   Number of children: 0   Years of education: Not on file   Highest education level: Not on file  Occupational History    Employer: [employer]  Tobacco Use   Smoking status: Never   Smokeless tobacco: Never  Vaping Use   Vaping Use: Never used  Substance and Sexual Activity   Alcohol use: Yes    Comment: occasional   Drug use: No   Sexual activity: Not on  file  Other Topics Concern   Not on file  Social History Narrative   Not on file   Social Determinants of Health   Financial Resource Strain: Not on file  Food Insecurity: Not on file  Transportation Needs: Not on file  Physical Activity: Not on file  Stress: Not on file  Social Connections: Not on file  Intimate Partner Violence: Not on file    Past Surgical History:  Procedure Laterality Date   WISDOM TOOTH EXTRACTION     age 34    Family History  Problem Relation Age of Onset   Diabetes Mother        pre diabetic   Obesity Mother    Diabetes Father    Hypertension Father    Hyperlipidemia Father    ALS Father    Cancer Other        breast   Breast cancer Other    Diabetes Maternal Grandmother    Diabetes Cousin     No Known Allergies  Current Outpatient Medications on File Prior to Visit  Medication Sig Dispense Refill   albuterol (PROAIR HFA) 108 (90 Base) MCG/ACT inhaler Inhale 2 puffs EVERY 4 HOURS AS NEEDED FOR WHEEZING 8.5 g 0   DYMISTA 137-50  MCG/ACT SUSP Place 1 spray into the nose 2 (two) times daily as needed. 23 g 0   fexofenadine (ALLEGRA) 180 MG tablet Take 180 mg by mouth daily.       guaiFENesin (MUCINEX) 600 MG 12 hr tablet Take by mouth daily as needed.     Melatonin 1 MG TABS Take by mouth. prn     montelukast (SINGULAIR) 10 MG tablet      Multiple Vitamins-Minerals (WOMENS MULTI PO) Take by mouth daily.     norgestimate-ethinyl estradiol (SPRINTEC 28) 0.25-35 MG-MCG tablet TAKE ONE TABLET BY MOUTH EVERY DAY 84 tablet 2   ramipril (ALTACE) 10 MG capsule TAKE 1 CAPSULE BY MOUTH ONCE DAILY FOR BLOOD PRESSURE 90 capsule 3   No current facility-administered medications on file prior to visit.    BP (!) 142/86   Pulse 90   Temp (!) 97.1 F (36.2 C) (Temporal)   Ht 5' 3.25" (1.607 m)   Wt 181 lb (82.1 kg)   LMP 01/21/2022 (Exact Date)   SpO2 99%   BMI 31.81 kg/m  Objective:   Physical Exam Constitutional:      Appearance: She is  ill-appearing.  HENT:     Right Ear: Tympanic membrane and ear canal normal.     Left Ear: Tympanic membrane and ear canal normal.     Nose:     Right Sinus: No maxillary sinus tenderness or frontal sinus tenderness.     Left Sinus: No maxillary sinus tenderness or frontal sinus tenderness.     Mouth/Throat:     Pharynx: No posterior oropharyngeal erythema.  Eyes:     Conjunctiva/sclera: Conjunctivae normal.  Cardiovascular:     Rate and Rhythm: Normal rate and regular rhythm.  Pulmonary:     Effort: Pulmonary effort is normal.     Breath sounds: Normal breath sounds. No wheezing or rales.     Comments: Congested cough noted during visit  Musculoskeletal:     Cervical back: Neck supple.  Lymphadenopathy:     Cervical: No cervical adenopathy.  Skin:    General: Skin is warm and dry.           Assessment & Plan:   Problem List Items Addressed This Visit       Other   Acute cough - Primary    Could still be viral, but given her 2 week history of symptoms, coupled with her presentation, will treat. She does appear stable for outpatient treatment.   Start prednisone 20 mg tablets. Take 2 tablets by mouth once daily in the morning for 5 days. Start Azithromycin antibiotics. Take 2 tablets by mouth today, then 1 tablet daily for 4 additional days.  Continue albuterol inhaler PRN.  Follow up PRN.      Relevant Medications   predniSONE (DELTASONE) 20 MG tablet   azithromycin (ZITHROMAX) 250 MG tablet   Other Relevant Orders   POC COVID-19 (Completed)       Doreene Nest, NP

## 2022-02-17 NOTE — Assessment & Plan Note (Signed)
Could still be viral, but given her 2 week history of symptoms, coupled with her presentation, will treat. She does appear stable for outpatient treatment.   Start prednisone 20 mg tablets. Take 2 tablets by mouth once daily in the morning for 5 days. Start Azithromycin antibiotics. Take 2 tablets by mouth today, then 1 tablet daily for 4 additional days.  Continue albuterol inhaler PRN.  Follow up PRN.

## 2022-02-17 NOTE — Patient Instructions (Signed)
Start Azithromycin antibiotics for infection. Take 2 tablets by mouth today, then 1 tablet daily for 4 additional days.  Start prednisone 20 mg tablets. Take 2 tablets by mouth once daily in the morning for 5 days.  Continue the albuterol inhaler if needed.  It was a pleasure to see you today!

## 2022-03-20 ENCOUNTER — Telehealth: Payer: Self-pay

## 2022-03-20 DIAGNOSIS — Z1211 Encounter for screening for malignant neoplasm of colon: Secondary | ICD-10-CM

## 2022-03-20 NOTE — Telephone Encounter (Signed)
Per Maudie Mercury at [facility], has been rescheduled  THIS PATIENT ON FOR 1-26 WANTS TO BE MOVED TO 3-25 THANKS

## 2022-03-21 NOTE — Telephone Encounter (Signed)
Left voice message with patient to let her know her reschedule has been received.  Colonoscopy instructions updated to reflect new date.  Referral noted.  Thanks,  [place], [place]

## 2022-05-15 ENCOUNTER — Encounter: Payer: Self-pay | Admitting: Gastroenterology

## 2022-05-22 ENCOUNTER — Ambulatory Visit
Admission: RE | Admit: 2022-05-22 | Discharge: 2022-05-22 | Disposition: A | Discharge status: Home / Self Care | Payer: BC Managed Care – PPO | Attending: Gastroenterology | Admitting: Gastroenterology

## 2022-05-22 ENCOUNTER — Other Ambulatory Visit: Payer: Self-pay

## 2022-05-22 ENCOUNTER — Encounter: Payer: Self-pay | Admitting: Gastroenterology

## 2022-05-22 ENCOUNTER — Ambulatory Visit: Payer: BC Managed Care – PPO | Admitting: General Practice

## 2022-05-22 ENCOUNTER — Ambulatory Visit
Admission: RE | Disposition: A | Discharge status: Home / Self Care | Payer: Self-pay | Source: Home / Self Care | Attending: Gastroenterology

## 2022-05-22 DIAGNOSIS — K640 First degree hemorrhoids: Secondary | ICD-10-CM | POA: Insufficient documentation

## 2022-05-22 DIAGNOSIS — Z1211 Encounter for screening for malignant neoplasm of colon: Secondary | ICD-10-CM

## 2022-05-22 DIAGNOSIS — J45909 Unspecified asthma, uncomplicated: Secondary | ICD-10-CM | POA: Insufficient documentation

## 2022-05-22 DIAGNOSIS — I10 Essential (primary) hypertension: Secondary | ICD-10-CM | POA: Diagnosis not present

## 2022-05-22 HISTORY — PX: COLONOSCOPY WITH PROPOFOL: SHX5780

## 2022-05-22 HISTORY — DX: Encounter for screening for malignant neoplasm of colon: Z12.11

## 2022-05-22 HISTORY — DX: Presence of spectacles and contact lenses: Z97.3

## 2022-05-22 HISTORY — DX: Unspecified asthma, uncomplicated: J45.909

## 2022-05-22 LAB — POCT PREGNANCY, URINE: Preg Test, Ur: NEGATIVE

## 2022-05-22 SURGERY — COLONOSCOPY WITH PROPOFOL
Anesthesia: General | Site: Rectum

## 2022-05-22 MED ORDER — PROPOFOL 10 MG/ML IV BOLUS
INTRAVENOUS | Status: DC | PRN
  Administered 2022-05-22: 100 mg via INTRAVENOUS
  Administered 2022-05-22: 20 mg via INTRAVENOUS
  Administered 2022-05-22: 50 mg via INTRAVENOUS
  Administered 2022-05-22 (×2): 20 mg via INTRAVENOUS
  Administered 2022-05-22: 30 mg via INTRAVENOUS
  Administered 2022-05-22: 20 mg via INTRAVENOUS

## 2022-05-22 MED ORDER — SODIUM CHLORIDE 0.9 % IV SOLN: INTRAVENOUS | Status: DC

## 2022-05-22 MED ORDER — STERILE WATER FOR IRRIGATION IR SOLN
Status: DC | PRN
  Administered 2022-05-22: 50 mL

## 2022-05-22 MED ORDER — LACTATED RINGERS IV SOLN: INTRAVENOUS | Status: DC

## 2022-05-22 MED ORDER — LIDOCAINE HCL (CARDIAC) PF 100 MG/5ML IV SOSY
PREFILLED_SYRINGE | INTRAVENOUS | Status: DC | PRN
  Administered 2022-05-22: 60 mg via INTRAVENOUS

## 2022-05-22 SURGICAL SUPPLY — 6 items
GOWN CVR UNV OPN BCK APRN NK (MISCELLANEOUS) ×2 IMPLANT
GOWN ISOL THUMB LOOP REG UNIV (MISCELLANEOUS) ×2
KIT PRC NS LF DISP ENDO (KITS) ×1 IMPLANT
KIT PROCEDURE OLYMPUS (KITS) ×1
MANIFOLD NEPTUNE II (INSTRUMENTS) ×1 IMPLANT
WATER STERILE IRR 250ML POUR (IV SOLUTION) ×1 IMPLANT

## 2022-05-22 NOTE — H&P (Signed)
Lucilla Lame, MD [facility] [address] [place], [place] [address] Phone: [phone] Fax : [phone]  Primary Care Physician:  Pleas Koch, NP Primary Gastroenterologist:  Dr. Allen Norris  Pre-Procedure History & Physical: HPI:  Shari Armstrong is a 47 y.o. female is here for a screening colonoscopy.   Past Medical History:  Diagnosis Date   Allergy    Asthma    exercise/allergy induced   Elevated LFTs 10/11/2018   Hypertension    Laryngitis 11/09/2021   Seizures ([place])    last seizure at 47 yo.  meds until age 14.   Wears contact lenses     Past Surgical History:  Procedure Laterality Date   WISDOM TOOTH EXTRACTION     age 71    Prior to Admission medications   Medication Sig Start Date End Date Taking? Authorizing Provider  albuterol (PROAIR HFA) 108 (90 Base) MCG/ACT inhaler Inhale 2 puffs EVERY 4 HOURS AS NEEDED FOR WHEEZING 10/09/17  Yes Pleas Koch, NP  DYMISTA 137-50 MCG/ACT SUSP Place 1 spray into the nose 2 (two) times daily as needed. 10/17/19  Yes Pleas Koch, NP  fexofenadine (ALLEGRA) 180 MG tablet Take 180 mg by mouth daily.     Yes [provider]  Melatonin 1 MG TABS Take by mouth. prn   Yes [provider]  montelukast (SINGULAIR) 10 MG tablet  12/05/15  Yes [provider]  Multiple Vitamins-Minerals (WOMENS MULTI PO) Take by mouth daily.   Yes [provider]  norgestimate-ethinyl estradiol ([place] 28) 0.25-35 MG-MCG tablet TAKE ONE TABLET BY MOUTH EVERY DAY 01/18/22  Yes Pleas Koch, NP  ramipril (ALTACE) 10 MG capsule TAKE 1 CAPSULE BY MOUTH ONCE DAILY FOR BLOOD PRESSURE 12/02/21  Yes Pleas Koch, NP  guaiFENesin (MUCINEX) 600 MG 12 hr tablet Take by mouth daily as needed. Patient not taking: Reported on 05/15/2022    [provider]    Allergies as of 12/05/2021   (No Known Allergies)    Family History  Problem Relation Age of Onset   Diabetes Mother        pre  diabetic   Obesity Mother    Diabetes Father    Hypertension Father    Hyperlipidemia Father    ALS Father    Cancer Other        breast   Breast cancer Other    Diabetes Maternal Grandmother    Diabetes Cousin     Social History   Socioeconomic History   Marital status: Single    Spouse name: Not on file   Number of children: 0   Years of education: Not on file   Highest education level: Not on file  Occupational History    Employer: [employer]  Tobacco Use   Smoking status: Never   Smokeless tobacco: Never  Vaping Use   Vaping Use: Never used  Substance and Sexual Activity   Alcohol use: Yes    Comment: occasional   Drug use: No   Sexual activity: Not on file  Other Topics Concern   Not on file  Social History Narrative   Not on file   Social Determinants of Health   Financial Resource Strain: Not on file  Food Insecurity: Not on file  Transportation Needs: Not on file  Physical Activity: Not on file  Stress: Not on file  Social Connections: Not on file  Intimate Partner Violence: Not on file    Review of Systems:  See HPI, otherwise negative ROS  Physical Exam: BP (!) 158/101   Pulse 92   Temp 97.6 F (36.4 C) (Temporal)   Resp 20   Ht 5' 3.25" (1.607 m)   Wt 83.9 kg   LMP 05/12/2022 (Exact Date)   SpO2 97%   BMI 32.51 kg/m  General:   Alert,  pleasant and cooperative in NAD Head:  Normocephalic and atraumatic. Neck:  Supple; no masses or thyromegaly. Lungs:  Clear throughout to auscultation.    Heart:  Regular rate and rhythm. Abdomen:  Soft, nontender and nondistended. Normal bowel sounds, without guarding, and without rebound.   Neurologic:  Alert and  oriented x4;  grossly normal neurologically.  Impression/Plan: Shari Armstrong is now here to undergo a screening colonoscopy.  Risks, benefits, and alternatives regarding colonoscopy have been reviewed with the patient.  Questions have been answered.  All parties agreeable.

## 2022-05-22 NOTE — Transfer of Care (Signed)
Immediate Anesthesia Transfer of Care Note  Patient: Shari Armstrong  Procedure(s) Performed: COLONOSCOPY WITH PROPOFOL (Rectum)  Patient Location: PACU  Anesthesia Type: General  Level of Consciousness: awake, alert  and patient cooperative  Airway and Oxygen Therapy: Patient Spontanous Breathing and Patient connected to supplemental oxygen  Post-op Assessment: Post-op Vital signs reviewed, Patient's Cardiovascular Status Stable, Respiratory Function Stable, Patent Airway and No signs of Nausea or vomiting  Post-op Vital Signs: Reviewed and stable  Complications: No notable events documented.

## 2022-05-22 NOTE — Op Note (Signed)
[facility] Gastroenterology Patient Name: Shari Armstrong Procedure Date: 05/22/2022 8:57 AM MRN: [id number] Account #: [id number] Date of Birth: 09-26-1975 Admit Type: Outpatient Age: 47 Room: [facility] OR ROOM 01 Gender: Female Note Status: Finalized Instrument Name: [id number] Procedure:             Colonoscopy Indications:           Screening for colorectal malignant neoplasm Providers:             Lucilla Lame MD, MD Referring MD:          Pleas Koch (Referring MD) Medicines:             Propofol per Anesthesia Complications:         No immediate complications. Procedure:             Pre-Anesthesia Assessment:                        - Prior to the procedure, a History and Physical was                         performed, and patient medications and allergies were                         reviewed. The patient's tolerance of previous                         anesthesia was also reviewed. The risks and benefits                         of the procedure and the sedation options and risks                         were discussed with the patient. All questions were                         answered, and informed consent was obtained. Prior                         Anticoagulants: The patient has taken no anticoagulant                         or antiplatelet agents. ASA Grade Assessment: II - A                         patient with mild systemic disease. After reviewing                         the risks and benefits, the patient was deemed in                         satisfactory condition to undergo the procedure.                        After obtaining informed consent, the colonoscope was                         passed under direct vision. Throughout the procedure,  the patient's blood pressure, pulse, and oxygen                         saturations were monitored continuously. The                         Colonoscope was introduced through the anus and                          advanced to the the cecum, identified by appendiceal                         orifice and ileocecal valve. The colonoscopy was                         performed without difficulty. The patient tolerated                         the procedure well. The quality of the bowel                         preparation was excellent. Findings:      The perianal and digital rectal examinations were normal.      Non-bleeding internal hemorrhoids were found during retroflexion. The       hemorrhoids were Grade I (internal hemorrhoids that do not prolapse). Impression:            - Non-bleeding internal hemorrhoids.                        - No specimens collected. Recommendation:        - Discharge patient to home.                        - Resume previous diet.                        - Continue present medications.                        - Repeat colonoscopy in 10 years for screening                         purposes. Procedure Code(s):     --- Professional ---                        [phone], Colonoscopy, flexible; diagnostic, including                         collection of specimen(s) by brushing or washing, when                         performed (separate procedure) Diagnosis Code(s):     --- Professional ---                        Z12.11, Encounter for screening for malignant neoplasm                         of colon CPT copyright 2022 American Medical Association. All rights reserved. The  codes documented in this report are preliminary and upon coder review may  be revised to meet current compliance requirements. Lucilla Lame MD, MD 05/22/2022 9:17:19 AM This report has been signed electronically. Number of Addenda: 0 Note Initiated On: 05/22/2022 8:57 AM Scope Withdrawal Time: 0 hours 6 minutes 41 seconds  Total Procedure Duration: 0 hours 10 minutes 48 seconds  Estimated Blood Loss:  Estimated blood loss: none.      [facility]

## 2022-05-22 NOTE — Anesthesia Preprocedure Evaluation (Signed)
Anesthesia Evaluation  Patient identified by MRN, date of birth, ID band Patient awake    Reviewed: Allergy & Precautions, NPO status , Patient's Chart, lab work & pertinent test results  Airway Mallampati: II  TM Distance: >3 FB Neck ROM: full    Dental  (+) Chipped, Dental Advidsory Given   Pulmonary neg pulmonary ROS, asthma    Pulmonary exam normal        Cardiovascular hypertension, On Medications negative cardio ROS Normal cardiovascular exam     Neuro/Psych  PSYCHIATRIC DISORDERS Anxiety     negative neurological ROS     GI/Hepatic negative GI ROS, Neg liver ROS,,,  Endo/Other  negative endocrine ROS    Renal/GU negative Renal ROS  negative genitourinary   Musculoskeletal   Abdominal   Peds  Hematology negative hematology ROS (+)   Anesthesia Other Findings Past Medical History: No date: Allergy No date: Asthma     Comment:  exercise/allergy induced 10/11/2018: Elevated LFTs No date: Hypertension 11/09/2021: Laryngitis No date: Seizures ([place])     Comment:  last seizure at 47 yo.  meds until age 54. No date: Wears contact lenses  Past Surgical History: No date: WISDOM TOOTH EXTRACTION     Comment:  age 47  BMI    Body Mass Index: 32.51 kg/m      Reproductive/Obstetrics negative OB ROS                             Anesthesia Physical Anesthesia Plan  ASA: 2  Anesthesia Plan: General   Post-op Pain Management: Minimal or no pain anticipated   Induction: Intravenous  PONV Risk Score and Plan: 3 and Propofol infusion, TIVA and Ondansetron  Airway Management Planned: Nasal Cannula  Additional Equipment: None  Intra-op Plan:   Post-operative Plan:   Informed Consent: I have reviewed the patients History and Physical, chart, labs and discussed the procedure including the risks, benefits and alternatives for the proposed anesthesia with the patient or authorized  representative who has indicated his/her understanding and acceptance.     Dental advisory given  Plan Discussed with: CRNA and Surgeon  Anesthesia Plan Comments: (Discussed risks of anesthesia with patient, including possibility of difficulty with spontaneous ventilation under anesthesia necessitating airway intervention, PONV, and rare risks such as cardiac or respiratory or neurological events, and allergic reactions. Discussed the role of CRNA in patient's perioperative care. Patient understands.)        Anesthesia Quick Evaluation

## 2022-05-22 NOTE — Anesthesia Postprocedure Evaluation (Signed)
Anesthesia Post Note  Patient: Shari Armstrong  Procedure(s) Performed: COLONOSCOPY WITH PROPOFOL (Rectum)  Patient location during evaluation: PACU Anesthesia Type: General Level of consciousness: awake and alert Pain management: pain level controlled Vital Signs Assessment: post-procedure vital signs reviewed and stable Respiratory status: spontaneous breathing, nonlabored ventilation, respiratory function stable and patient connected to nasal cannula oxygen Cardiovascular status: blood pressure returned to baseline and stable Postop Assessment: no apparent nausea or vomiting Anesthetic complications: no  No notable events documented.   Last Vitals:  Vitals:   05/22/22 0919 05/22/22 0928  BP: (!) 99/57 126/79  Pulse: 87 93  Resp: (!) 21 12  Temp: 36.5 C 36.5 C  SpO2: 98% 99%    Last Pain:  Vitals:   05/22/22 0928  TempSrc:   PainSc: 0-No pain                 Dimas Millin

## 2022-05-23 ENCOUNTER — Encounter: Payer: Self-pay | Admitting: Gastroenterology

## 2022-06-07 DIAGNOSIS — J301 Allergic rhinitis due to pollen: Secondary | ICD-10-CM

## 2022-06-08 MED ORDER — MONTELUKAST SODIUM 10 MG PO TABS: 10.0000 mg | ORAL_TABLET | Freq: Every day | ORAL | 0 refills | Status: DC

## 2022-07-05 ENCOUNTER — Other Ambulatory Visit: Payer: Self-pay | Admitting: Primary Care

## 2022-07-05 DIAGNOSIS — J301 Allergic rhinitis due to pollen: Secondary | ICD-10-CM

## 2022-07-05 DIAGNOSIS — Z3041 Encounter for surveillance of contraceptive pills: Secondary | ICD-10-CM

## 2022-09-05 ENCOUNTER — Other Ambulatory Visit: Payer: Self-pay | Admitting: Primary Care

## 2022-09-05 DIAGNOSIS — I10 Essential (primary) hypertension: Secondary | ICD-10-CM

## 2022-09-06 NOTE — Telephone Encounter (Signed)
[facility] 1 year supply [facility]. Have her call the pharmacy for her refill.  Thanks!

## 2022-09-06 NOTE — Telephone Encounter (Signed)
Patient scheduled for CPE in October, patient did say she has only 3 days left of this medication. Please advise, thank you.

## 2022-09-06 NOTE — Telephone Encounter (Signed)
Left message on patients voicemail, per dpr. Advised to contact pharmacy for refills

## 2022-09-06 NOTE — Telephone Encounter (Signed)
Patient is due for CPE/follow up in mid October, this will be required prior to any further refills.  Please schedule, thank you!

## 2022-09-27 ENCOUNTER — Other Ambulatory Visit: Payer: Self-pay | Admitting: Primary Care

## 2022-09-27 DIAGNOSIS — Z3041 Encounter for surveillance of contraceptive pills: Secondary | ICD-10-CM

## 2022-10-05 ENCOUNTER — Other Ambulatory Visit: Payer: Self-pay | Admitting: Primary Care

## 2022-10-05 DIAGNOSIS — J301 Allergic rhinitis due to pollen: Secondary | ICD-10-CM

## 2022-12-05 ENCOUNTER — Other Ambulatory Visit: Payer: Self-pay

## 2022-12-05 ENCOUNTER — Ambulatory Visit: Payer: BC Managed Care – PPO | Admitting: Family Medicine

## 2022-12-05 ENCOUNTER — Emergency Department: Payer: BC Managed Care – PPO

## 2022-12-05 ENCOUNTER — Emergency Department
Admission: EM | Admit: 2022-12-05 | Discharge: 2022-12-05 | Disposition: A | Discharge status: Home / Self Care | Payer: BC Managed Care – PPO | Attending: Emergency Medicine | Admitting: Emergency Medicine

## 2022-12-05 ENCOUNTER — Telehealth: Payer: Self-pay | Admitting: Primary Care

## 2022-12-05 DIAGNOSIS — R0789 Other chest pain: Secondary | ICD-10-CM | POA: Insufficient documentation

## 2022-12-05 DIAGNOSIS — F430 Acute stress reaction: Secondary | ICD-10-CM | POA: Diagnosis not present

## 2022-12-05 DIAGNOSIS — R42 Dizziness and giddiness: Secondary | ICD-10-CM | POA: Diagnosis not present

## 2022-12-05 LAB — BASIC METABOLIC PANEL
Anion gap: 11 (ref 5–15)
BUN: 12 mg/dL (ref 6–20)
CO2: 21 mmol/L — ABNORMAL LOW (ref 22–32)
Calcium: 8.8 mg/dL — ABNORMAL LOW (ref 8.9–10.3)
Chloride: 104 mmol/L (ref 98–111)
Creatinine, Ser: 0.58 mg/dL (ref 0.44–1.00)
GFR, Estimated: >60 mL/min (ref >60)
Glucose, Bld: 169 mg/dL — ABNORMAL HIGH (ref 70–99)
Potassium: 3.9 mmol/L (ref 3.5–5.1)
Sodium: 136 mmol/L (ref 135–145)

## 2022-12-05 LAB — CBC
HCT: 41.8 % (ref 36.0–46.0)
Hemoglobin: 14.9 g/dL (ref 12.0–15.0)
MCH: 29.3 pg (ref 26.0–34.0)
MCHC: 35.6 g/dL (ref 30.0–36.0)
MCV: 82.1 fL (ref 80.0–100.0)
Platelets: 343 10*3/uL (ref 150–400)
RBC: 5.09 MIL/uL (ref 3.87–5.11)
RDW: 12.4 % (ref 11.5–15.5)
WBC: 11.9 10*3/uL — ABNORMAL HIGH (ref 4.0–10.5)
nRBC: 0 % (ref 0.0–0.2)

## 2022-12-05 LAB — T4, FREE: Free T4: 0.88 ng/dL (ref 0.61–1.12)

## 2022-12-05 LAB — TROPONIN I (HIGH SENSITIVITY)
Troponin I (High Sensitivity): 4 ng/L (ref <18)
Troponin I (High Sensitivity): 3 ng/L (ref <18)

## 2022-12-05 LAB — TSH: TSH: 1.104 u[IU]/mL (ref 0.350–4.500)

## 2022-12-05 MED ORDER — LORAZEPAM 1 MG PO TABS
1.0000 mg | ORAL_TABLET | Freq: Once | ORAL | Status: AC
  Administered 2022-12-05: 1 mg via ORAL
  Filled 2022-12-05: qty 1

## 2022-12-05 MED ORDER — PROPRANOLOL HCL 20 MG PO TABS: 20.0000 mg | ORAL_TABLET | Freq: Two times a day (BID) | ORAL | 0 refills | Status: DC

## 2022-12-05 MED ORDER — CLONIDINE HCL 0.1 MG PO TABS
0.1000 mg | ORAL_TABLET | Freq: Once | ORAL | Status: AC
  Administered 2022-12-05: 0.1 mg via ORAL
  Filled 2022-12-05: qty 1

## 2022-12-05 MED ORDER — PROPRANOLOL HCL 20 MG PO TABS
10.0000 mg | ORAL_TABLET | Freq: Once | ORAL | Status: AC
  Administered 2022-12-05: 10 mg via ORAL
  Filled 2022-12-05: qty 1

## 2022-12-05 MED ORDER — LORAZEPAM 1 MG PO TABS: 1.0000 mg | ORAL_TABLET | Freq: Three times a day (TID) | ORAL | 0 refills | Status: AC | PRN

## 2022-12-05 NOTE — ED Provider Notes (Signed)
[facility] Provider Note    Event Date/Time   First MD Initiated Contact with Patient 12/05/22 [phone]     (approximate)   History   Chest Pain   HPI  Shari Armstrong is a 47 y.o. female here with chest pain for the patient states that off-and-on for the last several weeks, she has had intermittent episodes of dull, aching, but also sharp left-sided chest pain.  This seems to occur when she is stressed at work and resolves when she is removed from stressors.  She states that earlier today, she woke up and felt lightheaded and somewhat anxious.  She started to walk and continue to feel lightheaded and felt like she was may be going to pass out.  She also developed some mild chest pressure with this.  She subsequent resents for evaluation.  She does admit to significant recent increase stress, as well as what feels like increasing blood pressure.  She takes ramipril daily for this.  No other recent medication changes.     Physical Exam   Triage Vital Signs: ED Triage Vitals  Encounter Vitals Group     BP 12/05/22 0912 (!) 177/107     Systolic BP Percentile --      Diastolic BP Percentile --      Pulse Rate 12/05/22 0912 95     Resp 12/05/22 1000 12     Temp 12/05/22 0912 97.8 F (36.6 C)     Temp Source 12/05/22 0912 Oral     SpO2 12/05/22 0912 100 %     Weight 12/05/22 0908 210 lb (95.3 kg)     Height 12/05/22 0908 5\' 3"  (1.6 m)     Head Circumference --      Peak Flow --      Pain Score 12/05/22 0908 7     Pain Loc --      Pain Education --      Exclude from Growth Chart --     Most recent vital signs: Vitals:   12/05/22 1000 12/05/22 1200  BP: (!) 158/96 (!) 160/109  Pulse: 84 76  Resp: 12 11  Temp:    SpO2: 100% 100%     General: Awake, no distress.  CV:  Good peripheral perfusion.  Regular rate and rhythm.  No murmurs or rubs. Resp:  Normal work of breathing.  Lungs clear to auscultation bilaterally.  No chest wall  tenderness. Abd:  No distention.  No tenderness. Other:  Significantly anxious, occasionally tearful.   ED Results / Procedures / Treatments   Labs (all labs ordered are listed, but only abnormal results are displayed) Labs Reviewed  BASIC METABOLIC PANEL - Abnormal; Notable for the following components:      Result Value   CO2 21 (*)    Glucose, Bld 169 (*)    Calcium 8.8 (*)    All other components within normal limits  CBC - Abnormal; Notable for the following components:   WBC 11.9 (*)    All other components within normal limits  T4, FREE  TSH  POC URINE PREG, ED  TROPONIN I (HIGH SENSITIVITY)  TROPONIN I (HIGH SENSITIVITY)     EKG Sinus tachycardia, Triklo at 106.  PR 136, QRS 80, QTc 472.  No acute ST elevations or depression.  No acute ischemia or infarct.   RADIOLOGY Chest x-ray: Clear   I also independently reviewed and agree with radiologist interpretations.   PROCEDURES:  Critical Care performed: No  .  1-3 Lead EKG Interpretation  Performed by: Shaune Pollack, MD Authorized by: Shaune Pollack, MD     Interpretation: normal     ECG rate:  70-90   ECG rate assessment: normal     Rhythm: sinus rhythm     Ectopy: none     Conduction: normal   Comments:     Indication: Chest pain     MEDICATIONS ORDERED IN ED: Medications  propranolol (INDERAL) tablet 10 mg (10 mg Oral Given 12/05/22 1042)  LORazepam (ATIVAN) tablet 1 mg (1 mg Oral Given 12/05/22 1042)  cloNIDine (CATAPRES) tablet 0.1 mg (0.1 mg Oral Given 12/05/22 1258)     IMPRESSION / MDM / ASSESSMENT AND PLAN / ED COURSE  I reviewed the triage vital signs and the nursing notes.                              Differential diagnosis includes, but is not limited to, anxiety, ACS, GERD/gastritis, arrhythmia, musculoskeletal pain  Patient's presentation is most consistent with acute presentation with potential threat to life or bodily function.  The patient is on the cardiac monitor to  evaluate for evidence of arrhythmia and/or significant heart rate changes   47 year old female with chest pain with history of hypertension here with chest pain and lightheadedness.  Patient is very well-appearing.  She does appear significantly anxious.  Suspect symptoms are mostly secondary to anxiety with possible component of anxiety related hypertension with symptoms.  Chest x-ray is clear.  EKG is nonischemic.  Troponin negative x 2, do not suspect ACS and she is a low risk heart score.  Thyroid studies are normal.  CBC with minimal leukocytosis which seems to be baseline.  BMP unremarkable.  I suspect symptoms are primarily from anxiety but she does have baseline hypertension and may be transiently increasing.  I think propranolol would be very reasonable given that she is already maxed out on her ACE inhibitor, and this may help with her anxiety.  Will also trial a low-dose of Ativan as needed at home.  Return precautions given.   FINAL CLINICAL IMPRESSION(S) / ED DIAGNOSES   Final diagnoses:  Atypical chest pain  Stress reaction     Rx / DC Orders   ED Discharge Orders          Ordered    propranolol (INDERAL) 20 MG tablet  2 times daily        12/05/22 1349    LORazepam (ATIVAN) 1 MG tablet  Every 8 hours PRN        12/05/22 1349             Note:  This document was prepared using Dragon voice recognition softwareAnd lightheadedness and may include unintentional dictation errors.   Shaune Pollack, MD 12/05/22 1350

## 2022-12-05 NOTE — ED Notes (Signed)
See triage note, pt reports has had intermittent chest discomfort for past couple weeks. Woke up with dizziness this am. Ambulatory to treatment room. NAD noted

## 2022-12-05 NOTE — Telephone Encounter (Signed)
Noted, will await ED notes. 

## 2022-12-05 NOTE — Discharge Instructions (Addendum)
For your blood pressure and symptoms:  START propranolol 20 mg twice a day. You can split this to 10 mg and take that three times a day if needed. This will help your blood pressure and stress/anxiety symptoms.  Take the Ativan as needed for severe anxiety or sleep

## 2022-12-05 NOTE — ED Notes (Signed)
Pt ambulatory to restroom

## 2022-12-05 NOTE — ED Notes (Signed)
Pt given ice water and crackers as requested

## 2022-12-05 NOTE — Telephone Encounter (Signed)
FYI: This call has been transferred to Access Nurse. Once the result note has been entered staff can address the message at that time.  Patient called in with the following symptoms:  Red Word:dizziness  Patient called in and stated that she woke up feeling dizzy. She stated that she had a headache, some sweating, and was thirsty.   Please advise at Mobile [phone] (mobile)  Message is routed to Provider Pool and [facility] Triage

## 2022-12-05 NOTE — ED Triage Notes (Signed)
Pt presents to ED with c/o CP hats started this morning. Pt states HX of hypertension. Pt states pain is L sided near breast with no radiation.

## 2022-12-05 NOTE — Telephone Encounter (Signed)
Per chart review tab pt is presently at [facility] ED. Sending note to Allayne Gitelman NP and [facility] pool.

## 2022-12-07 ENCOUNTER — Other Ambulatory Visit: Payer: Self-pay | Admitting: Primary Care

## 2022-12-07 DIAGNOSIS — I10 Essential (primary) hypertension: Secondary | ICD-10-CM

## 2022-12-15 ENCOUNTER — Other Ambulatory Visit (HOSPITAL_COMMUNITY)
Admission: RE | Admit: 2022-12-15 | Discharge: 2022-12-15 | Disposition: A | Discharge status: Home / Self Care | Payer: BC Managed Care – PPO | Source: Ambulatory Visit | Attending: Primary Care | Admitting: Primary Care

## 2022-12-15 ENCOUNTER — Encounter: Payer: Self-pay | Admitting: Primary Care

## 2022-12-15 ENCOUNTER — Ambulatory Visit: Payer: BC Managed Care – PPO | Admitting: Primary Care

## 2022-12-15 VITALS — BP 128/74 | HR 70 | Temp 97.9°F | Ht 63.0 in | Wt 212.0 lb

## 2022-12-15 DIAGNOSIS — Z0000 Encounter for general adult medical examination without abnormal findings: Secondary | ICD-10-CM | POA: Diagnosis not present

## 2022-12-15 DIAGNOSIS — Z124 Encounter for screening for malignant neoplasm of cervix: Secondary | ICD-10-CM

## 2022-12-15 DIAGNOSIS — J4520 Mild intermittent asthma, uncomplicated: Secondary | ICD-10-CM

## 2022-12-15 DIAGNOSIS — I10 Essential (primary) hypertension: Secondary | ICD-10-CM

## 2022-12-15 DIAGNOSIS — G40909 Epilepsy, unspecified, not intractable, without status epilepticus: Secondary | ICD-10-CM

## 2022-12-15 DIAGNOSIS — L301 Dyshidrosis [pompholyx]: Secondary | ICD-10-CM

## 2022-12-15 DIAGNOSIS — Z23 Encounter for immunization: Secondary | ICD-10-CM | POA: Diagnosis not present

## 2022-12-15 DIAGNOSIS — F419 Anxiety disorder, unspecified: Secondary | ICD-10-CM

## 2022-12-15 DIAGNOSIS — Z3041 Encounter for surveillance of contraceptive pills: Secondary | ICD-10-CM

## 2022-12-15 DIAGNOSIS — J301 Allergic rhinitis due to pollen: Secondary | ICD-10-CM

## 2022-12-15 DIAGNOSIS — Z1231 Encounter for screening mammogram for malignant neoplasm of breast: Secondary | ICD-10-CM

## 2022-12-15 DIAGNOSIS — D72828 Other elevated white blood cell count: Secondary | ICD-10-CM

## 2022-12-15 LAB — LIPID PANEL
Cholesterol: 162 mg/dL (ref 0–200)
HDL: 37.2 mg/dL — ABNORMAL LOW (ref >39.00)
LDL Cholesterol: 89 mg/dL (ref 0–99)
NonHDL: 124.55
Total CHOL/HDL Ratio: 4
Triglycerides: 176 mg/dL — ABNORMAL HIGH (ref 0.0–149.0)
VLDL: 35.2 mg/dL (ref 0.0–40.0)

## 2022-12-15 LAB — HEMOGLOBIN A1C: Hgb A1c MFr Bld: 5.9 % (ref 4.6–6.5)

## 2022-12-15 NOTE — Patient Instructions (Signed)
Stop by the lab prior to leaving today. I will notify you of your results once received.   You may take the propranolol once or twice daily if needed.  Avoid use of lorazepam.   [facility] to schedule your mammogram.   It was a pleasure to see you today!

## 2022-12-15 NOTE — Assessment & Plan Note (Signed)
Controlled.  Continue to monitor.  

## 2022-12-15 NOTE — Progress Notes (Signed)
Subjective:    Patient ID: Shari Armstrong, female    DOB: 08/27/1975, 47 y.o.   MRN: [id number]  HPI  Shari Armstrong is a very pleasant 46 y.o. female who presents today for complete physical and follow up of chronic conditions.  Immunizations: -Tetanus: Completed in 2021 -Influenza: Influenza vaccine provided today.   Diet: Fair diet.  Exercise: No regular exercise.  Eye exam: Completes annually  Dental exam: Completes semi-annually    Pap Smear: August 2021, due today Mammogram: October 2021  Colonoscopy: Completed in March 2024, due 2034  BP Readings from Last 3 Encounters:  12/15/22 128/74  12/05/22 123/88  05/22/22 126/79       Review of Systems  Constitutional:  Negative for unexpected weight change.  HENT:  Negative for rhinorrhea.   Respiratory:  Negative for cough and shortness of breath.   Cardiovascular:  Negative for chest pain.  Gastrointestinal:  Negative for constipation and diarrhea.  Genitourinary:  Negative for difficulty urinating.       Irregular menses  Musculoskeletal:  Negative for arthralgias and myalgias.  Skin:  Negative for rash.  Allergic/Immunologic: Negative for environmental allergies.  Neurological:  Negative for dizziness, numbness and headaches.  Psychiatric/Behavioral:  The patient is nervous/anxious.          Past Medical History:  Diagnosis Date   Allergy    Asthma    exercise/allergy induced   Elevated LFTs 10/11/2018   Encounter for screening colonoscopy 05/22/2022   Hypertension    Laryngitis 11/09/2021   Seizures (HCC)    last seizure at 47 yo.  meds until age 48.   Wears contact lenses     Social History   Socioeconomic History   Marital status: Single    Spouse name: Not on file   Number of children: 0   Years of education: Not on file   Highest education level: Not on file  Occupational History    Employer: [employer]  Tobacco Use   Smoking status: Never   Smokeless tobacco: Never  Vaping  Use   Vaping status: Never Used  Substance and Sexual Activity   Alcohol use: Yes    Comment: occasional   Drug use: No   Sexual activity: Not on file  Other Topics Concern   Not on file  Social History Narrative   Not on file   Social Determinants of Health   Financial Resource Strain: Not on file  Food Insecurity: Not on file  Transportation Needs: Not on file  Physical Activity: Not on file  Stress: Not on file  Social Connections: Not on file  Intimate Partner Violence: Not on file    Past Surgical History:  Procedure Laterality Date   COLONOSCOPY WITH PROPOFOL N/A 05/22/2022   Procedure: COLONOSCOPY WITH PROPOFOL;  Surgeon: Midge Minium, MD;  Location: [facility];  Service: Endoscopy;  Laterality: N/A;   WISDOM TOOTH EXTRACTION     age 82    Family History  Problem Relation Age of Onset   Diabetes Mother        pre diabetic   Obesity Mother    Diabetes Father    Hypertension Father    Hyperlipidemia Father    ALS Father    Cancer Other        breast   Breast cancer Other    Diabetes Maternal Grandmother    Diabetes Cousin     No Known Allergies  Current Outpatient Medications on File Prior to Visit  Medication Sig Dispense Refill   albuterol (PROAIR HFA) 108 (90 Base) MCG/ACT inhaler Inhale 2 puffs EVERY 4 HOURS AS NEEDED FOR WHEEZING 8.5 g 0   DYMISTA 137-50 MCG/ACT SUSP Place 1 spray into the nose 2 (two) times daily as needed. 23 g 0   fexofenadine (ALLEGRA) 180 MG tablet Take 180 mg by mouth daily.       LORazepam (ATIVAN) 1 MG tablet Take 1 tablet (1 mg total) by mouth every 8 (eight) hours as needed for anxiety or sleep. 8 tablet 0   Melatonin 1 MG TABS Take by mouth. prn     montelukast (SINGULAIR) 10 MG tablet TAKE 1 TABLET BY MOUTH NIGHTLY FOR ALLERGIES 90 tablet 0   Multiple Vitamins-Minerals (WOMENS MULTI PO) Take by mouth daily.     norgestimate-ethinyl estradiol (SPRINTEC 28) 0.25-35 MG-MCG tablet TAKE ONE TABLET BY MOUTH EVERY DAY  84 tablet 0   propranolol (INDERAL) 20 MG tablet Take 1 tablet (20 mg total) by mouth 2 (two) times daily. 60 tablet 0   ramipril (ALTACE) 10 MG capsule TAKE 1 CAPSULE BY MOUTH ONCE DAILY FOR BLOOD PRESSURE 90 capsule 0   guaiFENesin (MUCINEX) 600 MG 12 hr tablet Take by mouth daily as needed. (Patient not taking: Reported on 12/15/2022)     No current facility-administered medications on file prior to visit.    BP 128/74   Pulse 70   Temp 97.9 F (36.6 C) (Temporal)   Ht 5\' 3"  (1.6 m)   Wt 212 lb (96.2 kg)   LMP 11/24/2022   SpO2 99%   BMI 37.55 kg/m  Objective:   Physical Exam Exam conducted with a chaperone present.  HENT:     Right Ear: Tympanic membrane and ear canal normal.     Left Ear: Tympanic membrane and ear canal normal.  Eyes:     Pupils: Pupils are equal, round, and reactive to light.  Cardiovascular:     Rate and Rhythm: Normal rate and regular rhythm.  Pulmonary:     Effort: Pulmonary effort is normal.     Breath sounds: Normal breath sounds.  Abdominal:     General: Bowel sounds are normal.     Palpations: Abdomen is soft.     Tenderness: There is no abdominal tenderness.  Genitourinary:    Labia:        Right: No tenderness or lesion.        Left: No tenderness or lesion.      Vagina: Normal.     Cervix: Normal.     Uterus: Normal.      Adnexa: Right adnexa normal and left adnexa normal.  Musculoskeletal:        General: Normal range of motion.     Cervical back: Neck supple.  Skin:    General: Skin is warm and dry.  Neurological:     Mental Status: She is alert and oriented to person, place, and time.     Cranial Nerves: No cranial nerve deficit.     Deep Tendon Reflexes:     Reflex Scores:      Patellar reflexes are 2+ on the right side and 2+ on the left side. Psychiatric:        Mood and Affect: Mood normal.           Assessment & Plan:  Preventative health care Assessment & Plan: Immunizations UTD. Influenza vaccine provided  today.  Pap smear due, completed today Mammogram due, orders placed. Colonoscopy UTD, due  2034  Discussed the importance of a healthy diet and regular exercise in order for weight loss, and to reduce the risk of further co-morbidity.  Exam stable. Labs pending.  Follow up in 1 year for repeat physical.    Essential hypertension Assessment & Plan: Controlled.  Continue ramipril 10 mg daily. Continue propranolol 20 mg 1-2 times daily.  BMP reviewed from ED visit earlier this month.  Orders: -     Lipid panel -     Hemoglobin A1c  Mild intermittent asthma without complication Assessment & Plan: Controlled.  Continue Albuterol inhaler PRN. Continue Singulair 10 mg HS   Seizure disorder [facility]) Assessment & Plan: No seizures in years. Continue to monitor off treatment.   Seasonal allergic rhinitis due to pollen Assessment & Plan: Controlled.  Continue Dymista nasal spray PRN, Singulair 10 mg HS, Allegra PRN   Dyshidrotic eczema Assessment & Plan: Controlled.  Continue to monitor.    Anxiety Assessment & Plan: Uncontrolled recently. Improving now. Reviewed recent ED visit with labs.  Continue propranolol 20 mg 1-2 times daily if needed. Discouraged use of lorazepam.     Encounter for birth control pills maintenance Assessment & Plan: Controlled.  Also seems to be going through perimenopause. Discussed the option for discontinuation of OCPs in the future.  Continue Sprintec 0.25-35 mg-mcg for now. Pap smear updated today   Neutrophilia Assessment & Plan: Determined benign per hematology. Repeat labs pending.    Screening mammogram for breast cancer -     3D Screening Mammogram, Left and Right; Future  Screening for cervical cancer -     Cytology - PAP        Doreene Nest, NP

## 2022-12-15 NOTE — Assessment & Plan Note (Signed)
Controlled.  Continue Dymista nasal spray PRN, Singulair 10 mg HS, Allegra PRN

## 2022-12-15 NOTE — Assessment & Plan Note (Signed)
Controlled.  Continue ramipril 10 mg daily. Continue propranolol 20 mg 1-2 times daily.  BMP reviewed from ED visit earlier this month.

## 2022-12-15 NOTE — Assessment & Plan Note (Signed)
Uncontrolled recently. Improving now. Reviewed recent ED visit with labs.  Continue propranolol 20 mg 1-2 times daily if needed. Discouraged use of lorazepam.

## 2022-12-15 NOTE — Assessment & Plan Note (Signed)
Immunizations UTD. Influenza vaccine provided today.  Pap smear due, completed today Mammogram due, orders placed. Colonoscopy UTD, due 2034  Discussed the importance of a healthy diet and regular exercise in order for weight loss, and to reduce the risk of further co-morbidity.  Exam stable. Labs pending.  Follow up in 1 year for repeat physical.

## 2022-12-15 NOTE — Assessment & Plan Note (Signed)
Controlled.  Also seems to be going through perimenopause. Discussed the option for discontinuation of OCPs in the future.  Continue Sprintec 0.25-35 mg-mcg for now. Pap smear updated today

## 2022-12-15 NOTE — Assessment & Plan Note (Signed)
Determined benign per hematology. Repeat labs pending.

## 2022-12-15 NOTE — Assessment & Plan Note (Signed)
Controlled.  Continue Albuterol inhaler PRN. Continue Singulair 10 mg HS

## 2022-12-15 NOTE — Assessment & Plan Note (Signed)
No seizures in years. Continue to monitor off treatment.

## 2022-12-20 ENCOUNTER — Other Ambulatory Visit: Payer: Self-pay | Admitting: Primary Care

## 2022-12-20 DIAGNOSIS — Z3041 Encounter for surveillance of contraceptive pills: Secondary | ICD-10-CM

## 2022-12-21 LAB — CYTOLOGY - PAP
Comment: NEGATIVE
Diagnosis: NEGATIVE
High risk HPV: NEGATIVE

## 2023-01-12 DIAGNOSIS — F419 Anxiety disorder, unspecified: Secondary | ICD-10-CM

## 2023-01-12 MED ORDER — PROPRANOLOL HCL 20 MG PO TABS
20.0000 mg | ORAL_TABLET | Freq: Two times a day (BID) | ORAL | 0 refills | Status: DC | PRN
Start: 2023-01-12 — End: 2023-02-13

## 2023-01-16 ENCOUNTER — Ambulatory Visit
Admission: RE | Admit: 2023-01-16 | Discharge: 2023-01-16 | Disposition: A | Discharge status: Home / Self Care | Payer: BC Managed Care – PPO | Source: Ambulatory Visit | Attending: Primary Care | Admitting: Primary Care

## 2023-01-16 DIAGNOSIS — Z1231 Encounter for screening mammogram for malignant neoplasm of breast: Secondary | ICD-10-CM | POA: Diagnosis present

## 2023-01-19 ENCOUNTER — Other Ambulatory Visit: Payer: Self-pay | Admitting: Primary Care

## 2023-01-19 DIAGNOSIS — R928 Other abnormal and inconclusive findings on diagnostic imaging of breast: Secondary | ICD-10-CM

## 2023-01-29 ENCOUNTER — Ambulatory Visit
Admission: RE | Admit: 2023-01-29 | Discharge: 2023-01-29 | Disposition: A | Discharge status: Home / Self Care | Payer: BC Managed Care – PPO | Source: Ambulatory Visit | Attending: Primary Care | Admitting: Primary Care

## 2023-01-29 ENCOUNTER — Ambulatory Visit
Admission: RE | Admit: 2023-01-29 | Discharge: 2023-01-29 | Disposition: A | Discharge status: Home / Self Care | Payer: BC Managed Care – PPO | Source: Ambulatory Visit | Attending: Primary Care

## 2023-01-29 DIAGNOSIS — R928 Other abnormal and inconclusive findings on diagnostic imaging of breast: Secondary | ICD-10-CM | POA: Diagnosis present

## 2023-02-13 ENCOUNTER — Other Ambulatory Visit: Payer: Self-pay | Admitting: Primary Care

## 2023-02-13 DIAGNOSIS — F419 Anxiety disorder, unspecified: Secondary | ICD-10-CM

## 2023-03-07 ENCOUNTER — Other Ambulatory Visit: Payer: Self-pay | Admitting: Primary Care

## 2023-03-07 DIAGNOSIS — I10 Essential (primary) hypertension: Secondary | ICD-10-CM

## 2023-04-05 ENCOUNTER — Other Ambulatory Visit: Payer: Self-pay | Admitting: Primary Care

## 2023-04-05 DIAGNOSIS — J301 Allergic rhinitis due to pollen: Secondary | ICD-10-CM

## 2023-04-09 ENCOUNTER — Encounter: Payer: Self-pay | Admitting: Family

## 2023-04-09 ENCOUNTER — Ambulatory Visit: Payer: Self-pay | Admitting: Primary Care

## 2023-04-09 ENCOUNTER — Ambulatory Visit: Payer: 59 | Admitting: Family

## 2023-04-09 VITALS — BP 132/78 | HR 93 | Temp 98.6°F | Ht 63.0 in | Wt 224.0 lb

## 2023-04-09 DIAGNOSIS — J40 Bronchitis, not specified as acute or chronic: Secondary | ICD-10-CM | POA: Insufficient documentation

## 2023-04-09 MED ORDER — AMOXICILLIN-POT CLAVULANATE 875-125 MG PO TABS
1.0000 | ORAL_TABLET | Freq: Two times a day (BID) | ORAL | 0 refills | Status: AC
Start: 2023-04-09 — End: 2023-04-16

## 2023-04-09 MED ORDER — PREDNISONE 10 MG PO TABS
ORAL_TABLET | ORAL | 0 refills | Status: DC
Start: 2023-04-09 — End: 2023-08-09

## 2023-04-09 NOTE — Patient Instructions (Signed)
 Start augmentin    Ensure to take probiotics while on antibiotics and also for 2 weeks after completion. This can either be by eating yogurt daily or taking a probiotic supplement over the counter such as Culturelle.It is important to re-colonize the gut with good bacteria and also to prevent any diarrheal infections associated with antibiotic use.     Please start prednisone  if needed for wheezing, chest tightness

## 2023-04-09 NOTE — Telephone Encounter (Signed)
 Copied from CRM [id number]. Topic: Clinical - Red Word Triage >> Apr 09, 2023  8:17 AM Shari Armstrong wrote: Red Word that prompted transfer to Nurse Triage: Hasn't been feeling well for the past couple of days, increased cough, running a fever, ears are hurting, and struggling to breathe.   Chief Complaint: wheezing, shortness of breath Symptoms: productive cough, earache  Frequency: a couple of days Pertinent Negatives: Patient denies chest pain Disposition: [] ED /[] Urgent Care (no appt availability in office) / [x] Appointment(In office/virtual)/ []  [facility]/ [] Home Care/ [] Refused Recommended Disposition /[] [facility]/ []  Follow-up with PCP Additional Notes: The patient reported for the past couple of days she has had a productive cough, her ears are hurting and intermittent wheezing and shortness of breath.  She has a history of asthma and has been using her rescue inhaler every 6 hours that has been helpful.  Her sputum is a pale green.  She stated that her temperature this morning is 98.9.  She was scheduled for a same day appointment at a different location due to no earlier availability at her normal office.   Reason for Disposition  [1] MILD difficulty breathing (e.g., minimal/no SOB at rest, SOB with walking, pulse <100) AND [2] NEW-onset or WORSE than normal  Answer Assessment - Initial Assessment Questions 1. RESPIRATORY STATUS: "Describe your breathing?" (e.g., wheezing, shortness of breath, unable to speak, severe coughing)      Wheezing - using albuterol  regularly 2. ONSET: "When did this breathing problem begin?"      Yesterday  3. PATTERN "Does the difficult breathing come and go, or has it been constant since it started?"      Comes and goes; rescue inhaler helps 4. SEVERITY: "How bad is your breathing?" (e.g., mild, moderate, severe)    - MILD: No SOB at rest, mild SOB with walking, speaks normally in sentences, can lie down, no retractions, pulse < 100.     - MODERATE: SOB at rest, SOB with minimal exertion and prefers to sit, cannot lie down flat, speaks in phrases, mild retractions, audible wheezing, pulse 100-120.    - SEVERE: Very SOB at rest, speaks in single words, struggling to breathe, sitting hunched forward, retractions, pulse > 120    Wheezing  Intermittent at rest; cold-like symptoms; worse with laying down  5. RECURRENT SYMPTOM: "Have you had difficulty breathing before?" If Yes, ask: "When was the last time?" and "What happened that time?"      With uri  7. LUNG HISTORY: "Do you have any history of lung disease?"  (e.g., pulmonary embolus, asthma, emphysema)     asthma 8. CAUSE: "What do you think is causing the breathing problem?"      Unsure  9. OTHER SYMPTOMS: "Do you have any other symptoms? (e.g., dizziness, runny nose, cough, chest pain, fever)     Ears hurting, productive cough - pale green, temp 2 hours ago 98.9  Protocols used: Breathing Difficulty-A-AH

## 2023-04-09 NOTE — Assessment & Plan Note (Addendum)
 Afebrile. No acute respiratory distress. Unable to swab for strep due to gag reflex.  Treating empirically with Augmentin . Negative flu, covid.  Provided prednisone  taper if needed in setting of asthma, episodic wheezing.

## 2023-04-09 NOTE — Progress Notes (Signed)
 Assessment & Plan:  Bronchitis Assessment & Plan: Afebrile. No acute respiratory distress. Unable to swab for strep due to gag reflex.  Treating empirically with Augmentin . Negative flu, covid.  Provided prednisone  taper if needed in setting of asthma, episodic wheezing.  Orders: -     predniSONE ; Take 40 mg by mouth on day 1, then taper 10 mg daily until gone  Dispense: 10 tablet; Refill: 0 -     Amoxicillin -Pot Clavulanate; Take 1 tablet by mouth 2 (two) times daily for 7 days.  Dispense: 14 tablet; Refill: 0     Return precautions given.   Risks, benefits, and alternatives of the medications and treatment plan prescribed today were discussed, and patient expressed understanding.   Education regarding symptom management and diagnosis given to patient on AVS either electronically or printed.  Return if symptoms worsen or fail to improve.  Bascom Bossier, FNP  Subjective:    Patient ID: Shari Armstrong, female    DOB: Aug 10, 1975, 48 y.o.   MRN: [id number]  CC: Shari Armstrong is a 48 y.o. female who presents today for an acute visit.    HPI:  Complains of cough x 7 days, worse yesterday  Endorses chest tightness for the past week which reminds her of prior asthma exacerbation.  Endorses fever ( her normal temperature is 97),  chills, thin nasal conestion, facial pain, bilateral sore throat  She is using albuterol  for occasional wheezing with resolve after albuterol .   Compliant with singulair , allegra.   Denies CP, left arm numbness, teeth pain, ankle swelling  No recent abx or asthma exacerbation   She is a teacher      Allergies: Patient has no known allergies. Current Outpatient Medications on File Prior to Visit  Medication Sig Dispense Refill   albuterol  (PROAIR  HFA) 108 (90 Base) MCG/ACT inhaler Inhale 2 puffs EVERY 4 HOURS AS NEEDED FOR WHEEZING 8.5 g 0   DYMISTA  137-50 MCG/ACT SUSP Place 1 spray into the nose 2 (two) times daily as needed. 23 g 0    fexofenadine (ALLEGRA) 180 MG tablet Take 180 mg by mouth daily.       LORazepam  (ATIVAN ) 1 MG tablet Take 1 tablet (1 mg total) by mouth every 8 (eight) hours as needed for anxiety or sleep. 8 tablet 0   Melatonin 1 MG TABS Take by mouth. prn     montelukast  (SINGULAIR ) 10 MG tablet TAKE 1 TABLET BY MOUTH NIGHTLY FOR ALLERGIES 90 tablet 1   Multiple Vitamins-Minerals (WOMENS MULTI PO) Take by mouth daily.     norgestimate -ethinyl estradiol  (SPRINTEC 28) 0.25-35 MG-MCG tablet Take 1 tablet by mouth daily. 84 tablet 3   propranolol  (INDERAL ) 20 MG tablet TAKE ONE TABLET BY MOUTH TWICE DAILY AS NEEDED FOR (ANXIETY) 180 tablet 0   ramipril  (ALTACE ) 10 MG capsule TAKE 1 CAPSULE BY MOUTH ONCE DAILY FOR BLOOD PRESSURE 90 capsule 2   guaiFENesin (MUCINEX) 600 MG 12 hr tablet Take by mouth daily as needed. (Patient not taking: Reported on 04/09/2023)     No current facility-administered medications on file prior to visit.    Review of Systems  Constitutional:  Negative for chills and fever.  HENT:  Positive for congestion, sinus pressure and sore throat. Negative for trouble swallowing.   Respiratory:  Positive for cough and wheezing. Negative for shortness of breath.   Cardiovascular:  Negative for chest pain and palpitations.  Gastrointestinal:  Negative for nausea and vomiting.      Objective:  BP 132/78   Pulse 93   Temp 98.6 F (37 C) (Oral)   Ht 5\' 3"  (1.6 m)   Wt 224 lb (101.6 kg)   LMP  (LMP Unknown)   SpO2 98%   BMI 39.68 kg/m   BP Readings from Last 3 Encounters:  04/09/23 132/78  12/15/22 128/74  12/05/22 123/88   Wt Readings from Last 3 Encounters:  04/09/23 224 lb (101.6 kg)  12/15/22 212 lb (96.2 kg)  12/05/22 210 lb (95.3 kg)    Physical Exam Vitals reviewed.  Constitutional:      Appearance: She is well-developed.  HENT:     Head: Normocephalic and atraumatic.     Right Ear: Hearing, tympanic membrane, ear canal and external ear normal. No decreased hearing  noted. No drainage, swelling or tenderness. No middle ear effusion. No foreign body. Tympanic membrane is not erythematous or bulging.     Left Ear: Hearing, tympanic membrane, ear canal and external ear normal. No decreased hearing noted. No drainage, swelling or tenderness.  No middle ear effusion. No foreign body. Tympanic membrane is not erythematous or bulging.     Nose: Nose normal. No rhinorrhea.     Right Sinus: No maxillary sinus tenderness or frontal sinus tenderness.     Left Sinus: No maxillary sinus tenderness or frontal sinus tenderness.     Mouth/Throat:     Pharynx: Uvula midline. No oropharyngeal exudate or posterior oropharyngeal erythema.     Tonsils: No tonsillar abscesses.  Eyes:     Conjunctiva/sclera: Conjunctivae normal.  Cardiovascular:     Rate and Rhythm: Regular rhythm.     Pulses: Normal pulses.     Heart sounds: Normal heart sounds.  Pulmonary:     Effort: Pulmonary effort is normal.     Breath sounds: Normal breath sounds. No wheezing, rhonchi or rales.  Lymphadenopathy:     Head:     Right side of head: No submental, submandibular, tonsillar, preauricular, posterior auricular or occipital adenopathy.     Left side of head: No submental, submandibular, tonsillar, preauricular, posterior auricular or occipital adenopathy.     Cervical: No cervical adenopathy.  Skin:    General: Skin is warm and dry.  Neurological:     Mental Status: She is alert.  Psychiatric:        Speech: Speech normal.        Behavior: Behavior normal.        Thought Content: Thought content normal.

## 2023-07-04 ENCOUNTER — Other Ambulatory Visit: Payer: Self-pay | Admitting: Primary Care

## 2023-07-04 DIAGNOSIS — J301 Allergic rhinitis due to pollen: Secondary | ICD-10-CM

## 2023-08-09 ENCOUNTER — Ambulatory Visit (INDEPENDENT_AMBULATORY_CARE_PROVIDER_SITE_OTHER): Admitting: Primary Care

## 2023-08-09 VITALS — BP 114/80 | HR 90 | Temp 98.0°F | Ht 63.0 in | Wt 223.0 lb

## 2023-08-09 DIAGNOSIS — R21 Rash and other nonspecific skin eruption: Secondary | ICD-10-CM

## 2023-08-09 MED ORDER — PREDNISONE 20 MG PO TABS: ORAL_TABLET | ORAL | 0 refills | Status: DC

## 2023-08-09 NOTE — Progress Notes (Signed)
 Subjective:    Patient ID: Shari Armstrong, female    DOB: 1975/07/22, 48 y.o.   MRN: [id number]  Rash Pertinent negatives include no fever or shortness of breath.    Phillipa E Wieland is a very pleasant 48 y.o. female with a history of hypertension, asthma, allergic rhinitis, dyshidrotic eczema, neutrophilia who presents today to discuss rash.  Symptom onset 5 to 6 days ago with a rash to the bilateral upper chest and bilateral upper extremities, worse to the right antecubital fossa. Her rash is itchy.   She has been outdoors during a field day for school. Doesn't recall coming in contact with poison ivy.  No new lotions, detergents, soaps or shampoos. No new medicines, vitamins, supplements. No new pets. No recent outdoor exposure or poison ivy exposure. No bonfire or smoke exposure.  No recent motel or hotel stay or new beds.   No fevers/chills, oral lesions, new joint pains, tick bites, abdominal pain, nausea, wheezing, shortness of breath  She's applied Aquafor and taking oral benadryl with improvement. Today her rash is better, has gradually improved over the last few days.  Review of Systems  Constitutional:  Negative for fever.  Respiratory:  Negative for shortness of breath and wheezing.   Skin:  Positive for rash.         Past Medical History:  Diagnosis Date   Allergy    Asthma    exercise/allergy induced   Elevated LFTs 10/11/2018   Encounter for screening colonoscopy 05/22/2022   Hypertension    Laryngitis 11/09/2021   Seizures (HCC)    last seizure at 48 yo.  meds until age 53.   Wears contact lenses     Social History   Socioeconomic History   Marital status: Single    Spouse name: Not on file   Number of children: 0   Years of education: Not on file   Highest education level: Bachelor's degree (e.g., BA, AB, BS)  Occupational History    Employer: [employer]  Tobacco Use   Smoking status: Never   Smokeless tobacco: Never  Vaping Use    Vaping status: Never Used  Substance and Sexual Activity   Alcohol use: Yes    Comment: occasional   Drug use: No   Sexual activity: Not on file  Other Topics Concern   Not on file  Social History Narrative   Not on file   Social Drivers of Health   Financial Resource Strain: Low Risk  (08/09/2023)   Overall Financial Resource Strain (CARDIA)    Difficulty of Paying Living Expenses: Not very hard  Food Insecurity: No Food Insecurity (08/09/2023)   Hunger Vital Sign    Worried About Running Out of Food in the Last Year: Never true    Ran Out of Food in the Last Year: Never true  Transportation Needs: No Transportation Needs (08/09/2023)   PRAPARE - Administrator, Civil Service (Medical): No    Lack of Transportation (Non-Medical): No  Physical Activity: Sufficiently Active (08/09/2023)   Exercise Vital Sign    Days of Exercise per Week: 5 days    Minutes of Exercise per Session: 30 min  Stress: No Stress Concern Present (08/09/2023)   [employer] of Occupational Health - Occupational Stress Questionnaire    Feeling of Stress: Only a little  Social Connections: Moderately Integrated (08/09/2023)   Social Connection and Isolation Panel    Frequency of Communication with Friends and Family: More than three times a  week    Frequency of Social Gatherings with Friends and Family: More than three times a week    Attends Religious Services: More than 4 times per year    Active Member of Clubs or Organizations: Yes    Attends Banker Meetings: More than 4 times per year    Marital Status: Never married  Intimate Partner Violence: Not on file    Past Surgical History:  Procedure Laterality Date   COLONOSCOPY WITH PROPOFOL  N/A 05/22/2022   Procedure: COLONOSCOPY WITH PROPOFOL ;  Surgeon: Marnee Sink, MD;  Location: [facility];  Service: Endoscopy;  Laterality: N/A;   WISDOM TOOTH EXTRACTION     age 82    Family History  Problem Relation Age of  Onset   Diabetes Mother        pre diabetic   Obesity Mother    Diabetes Father    Hypertension Father    Hyperlipidemia Father    ALS Father    Cancer Other        breast   Breast cancer Other    Diabetes Maternal Grandmother    Diabetes Cousin     No Known Allergies  Current Outpatient Medications on File Prior to Visit  Medication Sig Dispense Refill   albuterol  (PROAIR  HFA) 108 (90 Base) MCG/ACT inhaler Inhale 2 puffs EVERY 4 HOURS AS NEEDED FOR WHEEZING 8.5 g 0   DYMISTA  137-50 MCG/ACT SUSP Place 1 spray into the nose 2 (two) times daily as needed. 23 g 0   fexofenadine (ALLEGRA) 180 MG tablet Take 180 mg by mouth daily.       LORazepam  (ATIVAN ) 1 MG tablet Take 1 tablet (1 mg total) by mouth every 8 (eight) hours as needed for anxiety or sleep. 8 tablet 0   Melatonin 1 MG TABS Take by mouth. prn     montelukast  (SINGULAIR ) 10 MG tablet TAKE 1 TABLET BY MOUTH NIGHTLY FOR ALLERGIES 90 tablet 1   Multiple Vitamins-Minerals (WOMENS MULTI PO) Take by mouth daily.     norgestimate -ethinyl estradiol  (SPRINTEC 28) 0.25-35 MG-MCG tablet Take 1 tablet by mouth daily. 84 tablet 3   propranolol  (INDERAL ) 20 MG tablet TAKE ONE TABLET BY MOUTH TWICE DAILY AS NEEDED FOR (ANXIETY) 180 tablet 0   ramipril  (ALTACE ) 10 MG capsule TAKE 1 CAPSULE BY MOUTH ONCE DAILY FOR BLOOD PRESSURE 90 capsule 2   guaiFENesin (MUCINEX) 600 MG 12 hr tablet Take by mouth daily as needed. (Patient not taking: Reported on 08/09/2023)     No current facility-administered medications on file prior to visit.    BP 114/80   Pulse 90   Temp 98 F (36.7 C) (Temporal)   Ht 5' 3 (1.6 m)   Wt 223 lb (101.2 kg)   LMP 08/03/2023   SpO2 98%   BMI 39.50 kg/m  Objective:   Physical Exam  Cardiovascular:     Rate and Rhythm: Normal rate and regular rhythm.  Pulmonary:     Effort: Pulmonary effort is normal.     Breath sounds: Normal breath sounds.   Skin:    General: Skin is warm and dry.     Findings: Rash  present.     Comments: Mild to moderate widespread physical type rash to right antecubital fossa, left upper extremity, posterior neck, left lower extremity. Mild erythema. No open lesions   Neurological:     Mental Status: She is alert.           Assessment & Plan:  Rash and nonspecific skin eruption Assessment & Plan: Exam today mostly representative of poison ivy dermatitis. Discussed scenarios for which she could have contracted poison ivy dermatitis.  Exam today not representative of shingles or bedbugs, hives.  Start prednisone  tablets. Take two tablets my mouth once daily in the morning for four days, then one tablet once daily in the morning for four days.   Follow-up as needed.  Orders: -     predniSONE ; Take two tablets my mouth once daily in the morning for four days, then one tablet once daily in the morning for four days.  Dispense: 12 tablet; Refill: 0        Gabriel John, NP

## 2023-08-09 NOTE — Patient Instructions (Signed)
 Start prednisone  tablets. Take two tablets my mouth once daily in the morning for four days, then one tablet once daily in the morning for four days.   It was a pleasure to see you today!

## 2023-08-09 NOTE — Assessment & Plan Note (Signed)
 Exam today mostly representative of poison ivy dermatitis. Discussed scenarios for which she could have contracted poison ivy dermatitis.  Exam today not representative of shingles or bedbugs, hives.  Start prednisone  tablets. Take two tablets my mouth once daily in the morning for four days, then one tablet once daily in the morning for four days.   Follow-up as needed.

## 2023-08-21 ENCOUNTER — Other Ambulatory Visit: Payer: Self-pay | Admitting: Primary Care

## 2023-08-21 DIAGNOSIS — F419 Anxiety disorder, unspecified: Secondary | ICD-10-CM

## 2023-10-02 ENCOUNTER — Other Ambulatory Visit: Payer: Self-pay | Admitting: Primary Care

## 2023-10-02 DIAGNOSIS — J301 Allergic rhinitis due to pollen: Secondary | ICD-10-CM

## 2023-10-02 NOTE — Telephone Encounter (Signed)
Patient is due for CPE/follow up in late October, this will be required prior to any further refills.  Please schedule, thank you!

## 2023-11-08 ENCOUNTER — Other Ambulatory Visit: Payer: Self-pay | Admitting: Primary Care

## 2023-11-08 DIAGNOSIS — Z3041 Encounter for surveillance of contraceptive pills: Secondary | ICD-10-CM

## 2023-11-22 ENCOUNTER — Other Ambulatory Visit: Payer: Self-pay | Admitting: Primary Care

## 2023-11-22 DIAGNOSIS — I10 Essential (primary) hypertension: Secondary | ICD-10-CM

## 2023-11-22 DIAGNOSIS — F419 Anxiety disorder, unspecified: Secondary | ICD-10-CM

## 2023-12-18 ENCOUNTER — Ambulatory Visit: Admitting: Primary Care

## 2023-12-18 VITALS — BP 136/84 | HR 83 | Temp 97.2°F | Ht 63.0 in | Wt 229.0 lb

## 2023-12-18 DIAGNOSIS — Z23 Encounter for immunization: Secondary | ICD-10-CM | POA: Diagnosis not present

## 2023-12-18 DIAGNOSIS — J4520 Mild intermittent asthma, uncomplicated: Secondary | ICD-10-CM

## 2023-12-18 DIAGNOSIS — Z1231 Encounter for screening mammogram for malignant neoplasm of breast: Secondary | ICD-10-CM

## 2023-12-18 DIAGNOSIS — I10 Essential (primary) hypertension: Secondary | ICD-10-CM

## 2023-12-18 DIAGNOSIS — R7303 Prediabetes: Secondary | ICD-10-CM | POA: Diagnosis not present

## 2023-12-18 DIAGNOSIS — Z0000 Encounter for general adult medical examination without abnormal findings: Secondary | ICD-10-CM | POA: Diagnosis not present

## 2023-12-18 DIAGNOSIS — F419 Anxiety disorder, unspecified: Secondary | ICD-10-CM

## 2023-12-18 DIAGNOSIS — Z3041 Encounter for surveillance of contraceptive pills: Secondary | ICD-10-CM

## 2023-12-18 LAB — LIPID PANEL
Cholesterol: 170 mg/dL (ref 0–200)
HDL: 32.8 mg/dL — ABNORMAL LOW (ref >39.00)
LDL Cholesterol: 92 mg/dL (ref 0–99)
NonHDL: 137.56
Total CHOL/HDL Ratio: 5
Triglycerides: 229 mg/dL — ABNORMAL HIGH (ref 0.0–149.0)
VLDL: 45.8 mg/dL — ABNORMAL HIGH (ref 0.0–40.0)

## 2023-12-18 LAB — COMPREHENSIVE METABOLIC PANEL WITH GFR
ALT: 54 U/L — ABNORMAL HIGH (ref 0–35)
AST: 53 U/L — ABNORMAL HIGH (ref 0–37)
Albumin: 4.1 g/dL (ref 3.5–5.2)
Alkaline Phosphatase: 81 U/L (ref 39–117)
BUN: 9 mg/dL (ref 6–23)
CO2: 25 meq/L (ref 19–32)
Calcium: 8.9 mg/dL (ref 8.4–10.5)
Chloride: 104 meq/L (ref 96–112)
Creatinine, Ser: 0.54 mg/dL (ref 0.40–1.20)
GFR: 109.67 mL/min (ref >60.00)
Glucose, Bld: 127 mg/dL — ABNORMAL HIGH (ref 70–99)
Potassium: 4.4 meq/L (ref 3.5–5.1)
Sodium: 139 meq/L (ref 135–145)
Total Bilirubin: 0.4 mg/dL (ref 0.2–1.2)
Total Protein: 6.5 g/dL (ref 6.0–8.3)

## 2023-12-18 LAB — HEMOGLOBIN A1C: Hgb A1c MFr Bld: 6.5 % (ref 4.6–6.5)

## 2023-12-18 NOTE — Assessment & Plan Note (Signed)
 Stable.  Continue ramipril  10 mg daily. CMP pending.

## 2023-12-18 NOTE — Assessment & Plan Note (Signed)
 Controlled.  Continue propranolol  20 to 40 mg at bedtime.

## 2023-12-18 NOTE — Assessment & Plan Note (Signed)
 Immunizations UTD. Influenza vaccine provided today.  Pap smear UTD. Mammogram due in November/December, orders placed. Colonoscopy UTD, due 2034  Discussed the importance of a healthy diet and regular exercise in order for weight loss, and to reduce the risk of further co-morbidity.  Exam stable. Labs pending.  Follow up in 1 year for repeat physical.

## 2023-12-18 NOTE — Assessment & Plan Note (Signed)
 Symptoms suggestive of perimenopause.  Discussed this with patient today.  She would like to continue OCPs. Continue Sprintec 0.25-35 mcg daily.  Pap smear up-to-date.

## 2023-12-18 NOTE — Progress Notes (Signed)
 Subjective:    Patient ID: Shari Armstrong, female    DOB: 20-Jan-1976, 48 y.o.   MRN: [id number]  Shari Shari Armstrong is a very pleasant 48 y.o. female who presents today for complete physical and follow up of chronic conditions.  Immunizations: -Tetanus: Completed in 2021 -Influenza:   Diet: Fair diet.  Exercise: No regular exercise.  Eye exam: Completes annually  Dental exam: Completes semi-annually    Pap Smear: Completed in 2024 Mammogram: Completed in November and December 2024  Colonoscopy: Completed in 2024, due 2034   BP Readings from Last 3 Encounters:  12/18/23 136/84  08/09/23 114/80  04/09/23 132/78      Review of Systems  Constitutional:  Negative for unexpected weight change.  HENT:  Negative for rhinorrhea.   Respiratory:  Negative for cough and shortness of breath.   Cardiovascular:  Negative for chest pain.  Gastrointestinal:  Negative for constipation and diarrhea.  Genitourinary:  Negative for difficulty urinating.       Menstrual cycles less days  Musculoskeletal:  Negative for arthralgias and myalgias.  Skin:  Negative for rash.  Allergic/Immunologic: Negative for environmental allergies.  Neurological:  Negative for dizziness and headaches.  Psychiatric/Behavioral:  The patient is not nervous/anxious.          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  meds until age 58.   Wears contact lenses     Social History   Socioeconomic History   Marital status: Single    Spouse name: Not on file   Number of children: 0   Years of education: Not on file   Highest education level: Bachelor's degree (e.g., BA, AB, BS)  Occupational History    Employer: [employer]  Tobacco Use   Smoking status: Never   Smokeless tobacco: Never  Vaping Use   Vaping status: Never Used  Substance and Sexual Activity   Alcohol use: Yes    Comment: occasional   Drug use: No   Sexual activity: Not on file  Other Topics Concern   Not on file  Social History Narrative   Not on file   Social Drivers of Health   Financial Resource Strain: Low Risk  (12/18/2023)   Overall Financial Resource Strain (CARDIA)    Difficulty of Paying Living Expenses: Not very hard  Food Insecurity: Food Insecurity Present (12/18/2023)   Hunger Vital Sign    Worried About Running Out of Food in the Last Year: Sometimes true    Ran Out of Food in the Last Year: Never true  Transportation Needs: No Transportation Needs (12/18/2023)   PRAPARE - Administrator, Civil Service (Medical): No    Lack of Transportation (Non-Medical): No  Physical Activity: Insufficiently Active (12/18/2023)   Exercise Vital Sign    Days of Exercise per Week: 5 days    Minutes of Exercise per Session: 20 min  Stress: No Stress Concern Present (12/18/2023)   [employer] of Occupational Health - Occupational Stress Questionnaire    Feeling of Stress: Only a little  Social Connections: Moderately Integrated (12/18/2023)   Social Connection and Isolation Panel    Frequency of Communication with Friends and  Family: More than three times a week    Frequency of Social Gatherings with Friends and Family: More than three times a week    Attends Religious Services: More than 4 times per year    Active Member of Clubs or Organizations: Yes    Attends Banker Meetings: More than 4 times per year    Marital Status: Never married  Intimate Partner Violence: Not on file    Past Surgical History:  Procedure Laterality Date   COLONOSCOPY WITH PROPOFOL  N/A 05/22/2022   Procedure: COLONOSCOPY WITH PROPOFOL ;  Surgeon: Jinny Carmine, MD;  Location: [facility];  Service:  Endoscopy;  Laterality: N/A;   WISDOM TOOTH EXTRACTION     age 21    Family History  Problem Relation Age of Onset   Diabetes Mother        pre diabetic   Obesity Mother    Diabetes Father    Hypertension Father    Hyperlipidemia Father    ALS Father    Cancer Other        breast   Breast cancer Other    Diabetes Maternal Grandmother    Diabetes Cousin     No Known Allergies  Current Outpatient Medications on File Prior to Visit  Medication Sig Dispense Refill   albuterol  (PROAIR  HFA) 108 (90 Base) MCG/ACT inhaler Inhale 2 puffs EVERY 4 HOURS AS NEEDED FOR WHEEZING 8.5 g 0   DYMISTA  137-50 MCG/ACT SUSP Place 1 spray into the nose 2 (two) times daily as needed. 23 g 0   fexofenadine (ALLEGRA) 180 MG tablet Take 180 mg by mouth daily.       guaiFENesin (MUCINEX) 600 MG 12 hr tablet Take by mouth daily as needed.     Melatonin 1 MG TABS Take by mouth. prn     montelukast  (SINGULAIR ) 10 MG tablet TAKE 1 TABLET BY MOUTH NIGHTLY FOR ALLERGIES 90 tablet 0   Multiple Vitamins-Minerals (WOMENS MULTI PO) Take by mouth daily.     norgestimate -ethinyl estradiol  (SPRINTEC 28) 0.25-35 MG-MCG tablet TAKE 1 TABLET BY MOUTH ONCE DAILY 84 tablet 0   propranolol  (INDERAL ) 20 MG tablet TAKE ONE TABLET BY MOUTH TWICE DAILY AS NEEDED FOR (ANXIETY) 180 tablet 0   ramipril  (ALTACE ) 10 MG capsule TAKE 1 CAPSULE BY MOUTH ONCE DAILY FOR BLOOD PRESSURE 90 capsule 0   No current facility-administered medications on file prior to visit.    BP 136/84   Pulse 83   Temp (!) 97.2 F (36.2 C) (Temporal)   Ht 5' 3 (1.6 m)   Wt 229 lb (103.9 kg)   LMP 11/27/2023   SpO2 99%   BMI 40.57 kg/m  Objective:   Physical Exam HENT:     Right Ear: Tympanic membrane and ear canal normal.     Left Ear: Tympanic membrane and ear canal normal.  Eyes:     Pupils: Pupils are equal, round, and reactive to light.  Cardiovascular:     Rate and Rhythm: Normal rate and regular rhythm.  Pulmonary:     Effort:  Pulmonary effort is normal.     Breath sounds: Normal breath sounds.  Abdominal:     General: Bowel sounds are normal.     Palpations: Abdomen is soft.     Tenderness: There is no abdominal tenderness.  Musculoskeletal:        General: Normal range of motion.     Cervical back: Neck supple.  Skin:    General: Skin is  warm and dry.  Neurological:     Mental Status: She is alert and oriented to person, place, and time.     Cranial Nerves: No cranial nerve deficit.     Deep Tendon Reflexes:     Reflex Scores:      Patellar reflexes are 2+ on the right side and 2+ on the left side. Psychiatric:        Mood and Affect: Mood normal.     Physical Exam        Assessment & Plan:  Preventative health care Assessment & Plan: Immunizations UTD. Influenza vaccine provided today.  Pap smear UTD. Mammogram due in November/December, orders placed. Colonoscopy UTD, due 2034  Discussed the importance of a healthy diet and regular exercise in order for weight loss, and to reduce the risk of further co-morbidity.  Exam stable. Labs pending.  Follow up in 1 year for repeat physical.    Essential hypertension Assessment & Plan: Stable.  Continue ramipril  10 mg daily. CMP pending.  Orders: -     Lipid panel -     Comprehensive metabolic panel with GFR  Mild intermittent asthma without complication Assessment & Plan: Controlled.  Continue albuterol  inhaler PRN, Singulair  10 mg HS.   Anxiety Assessment & Plan: Controlled.  Continue propranolol  20 to 40 mg at bedtime.   Encounter for birth control pills maintenance Assessment & Plan: Symptoms suggestive of perimenopause.  Discussed this with patient today.  She would like to continue OCPs. Continue Sprintec 0.25-35 mcg daily.  Pap smear up-to-date.   Screening mammogram for breast cancer -     3D Screening Mammogram, Left and Right; Future  Prediabetes -     Hemoglobin A1c  Encounter for immunization -      Flu vaccine trivalent PF, 6mos and older(Flulaval,Afluria,Fluarix,Fluzone)    Assessment and Plan Assessment & Plan         Comer MARLA Gaskins, NP     History of Present Illness

## 2023-12-18 NOTE — Patient Instructions (Signed)
 Stop by the lab prior to leaving today. I will notify you of your results once received.   [facility] to schedule your [facility].   It was a pleasure to see you today!

## 2023-12-18 NOTE — Assessment & Plan Note (Signed)
 Controlled.  Continue albuterol  inhaler PRN, Singulair  10 mg HS.

## 2023-12-19 ENCOUNTER — Ambulatory Visit: Payer: Self-pay | Admitting: Primary Care

## 2023-12-26 ENCOUNTER — Telehealth: Admitting: Physician Assistant

## 2023-12-26 DIAGNOSIS — B9689 Other specified bacterial agents as the cause of diseases classified elsewhere: Secondary | ICD-10-CM | POA: Diagnosis not present

## 2023-12-26 DIAGNOSIS — J019 Acute sinusitis, unspecified: Secondary | ICD-10-CM | POA: Diagnosis not present

## 2023-12-26 MED ORDER — AMOXICILLIN-POT CLAVULANATE 875-125 MG PO TABS: 1.0000 | ORAL_TABLET | Freq: Two times a day (BID) | ORAL | 0 refills | Status: DC

## 2023-12-26 NOTE — Progress Notes (Signed)

## 2023-12-28 ENCOUNTER — Other Ambulatory Visit: Payer: Self-pay | Admitting: Primary Care

## 2023-12-28 DIAGNOSIS — J301 Allergic rhinitis due to pollen: Secondary | ICD-10-CM

## 2024-01-25 DIAGNOSIS — E1165 Type 2 diabetes mellitus with hyperglycemia: Secondary | ICD-10-CM

## 2024-01-28 NOTE — Telephone Encounter (Signed)
 Looks like this subject was mentioned in 12/18/23 Results F/u note. Does pt need f/u OV?

## 2024-02-03 MED ORDER — OZEMPIC (0.25 OR 0.5 MG/DOSE) 2 MG/3ML ~~LOC~~ SOPN: PEN_INJECTOR | SUBCUTANEOUS | 0 refills | Status: AC

## 2024-02-03 NOTE — Addendum Note (Signed)
 Addended by: Tykira Wachs K on: 02/03/2024 07:43 PM   Modules accepted: Orders

## 2024-02-07 ENCOUNTER — Other Ambulatory Visit (HOSPITAL_COMMUNITY): Payer: Self-pay

## 2024-02-07 ENCOUNTER — Telehealth: Payer: Self-pay

## 2024-02-07 NOTE — Telephone Encounter (Signed)
 Pharmacy Patient Advocate Encounter   Received notification from Onbase that prior authorization for Ozempic  2 is required/requested.   Insurance verification completed.   The patient is insured through [facility].   Per test claim: PA required; PA submitted to above mentioned insurance via Latent Key/confirmation #/EOC Rev[facility] Status is pending

## 2024-02-07 NOTE — Telephone Encounter (Signed)
 Pharmacy Patient Advocate Encounter  Received notification from [facility] that Prior Authorization for Ozempic  2 has been APPROVED from 02/07/24 to 02/07/27. Ran test claim, Copay is $49.99. This test claim was processed through [facility]- copay amounts may vary at other pharmacies due to pharmacy/plan contracts, or as the patient moves through the different stages of their insurance plan.   PA #/Case ID/Reference #: # [id number]

## 2024-02-08 NOTE — Telephone Encounter (Signed)
 Sounds great!

## 2024-02-16 ENCOUNTER — Other Ambulatory Visit: Payer: Self-pay | Admitting: Primary Care

## 2024-02-16 DIAGNOSIS — Z3041 Encounter for surveillance of contraceptive pills: Secondary | ICD-10-CM

## 2024-02-22 ENCOUNTER — Other Ambulatory Visit: Payer: Self-pay | Admitting: Primary Care

## 2024-02-22 DIAGNOSIS — I10 Essential (primary) hypertension: Secondary | ICD-10-CM

## 2024-03-15 ENCOUNTER — Telehealth: Admitting: Physician Assistant

## 2024-03-15 DIAGNOSIS — R0602 Shortness of breath: Secondary | ICD-10-CM

## 2024-03-15 NOTE — Progress Notes (Signed)
" °  Because of the severity of the symptoms that you have described, I feel your condition warrants further evaluation and I recommend that you be seen in a face-to-face visit.   NOTE: There will be NO CHARGE for this E-Visit   If you are having a true medical emergency, please call 911.     For an urgent face to face visit, [place] has multiple urgent care centers for your convenience.  Click the link below for the full list of locations and hours, walk-in wait times, appointment scheduling options and driving directions:  Urgent Care - [place], [place], [place], [place], [place], [place]  [place]     Your MyChart E-visit questionnaire answers were reviewed by a board certified advanced clinical practitioner to complete your personal care plan based on your specific symptoms.    Thank you for using e-Visits.    "

## 2024-03-17 ENCOUNTER — Encounter: Payer: Self-pay | Admitting: Internal Medicine

## 2024-03-17 ENCOUNTER — Ambulatory Visit: Admitting: Internal Medicine

## 2024-03-17 DIAGNOSIS — J208 Acute bronchitis due to other specified organisms: Secondary | ICD-10-CM | POA: Diagnosis not present

## 2024-03-17 DIAGNOSIS — B9689 Other specified bacterial agents as the cause of diseases classified elsewhere: Secondary | ICD-10-CM

## 2024-03-17 MED ORDER — PROMETHAZINE-DM 6.25-15 MG/5ML PO SYRP: 5.0000 mL | ORAL_SOLUTION | Freq: Four times a day (QID) | ORAL | 0 refills | Status: AC | PRN

## 2024-03-17 MED ORDER — AMOXICILLIN-POT CLAVULANATE 875-125 MG PO TABS: 1.0000 | ORAL_TABLET | Freq: Two times a day (BID) | ORAL | 0 refills | Status: AC

## 2024-03-17 NOTE — Progress Notes (Signed)
" ° °  Subjective:   Patient ID: Shari Armstrong, female    DOB: [birth year], 49 y.o.   MRN: [id number]  Discussed the use of AI scribe software for clinical note transcription with the patient, who gave verbal consent to proceed.  History of Present Illness Shari Armstrong is a 49 year old female who presents with a persistent cough and breathing difficulties.  She has been experiencing a persistent cough for about a week and a half, which has not resolved despite taking over-the-counter cough medicine. The cough is particularly troublesome at night, often waking her up. She has been using her inhaler more frequently due to difficulty breathing, especially when lying down.  Initially, she thought her symptoms were due to allergies and has been taking her allergy medication. She also took two leftover Augmentin  pills, which she typically uses for sinus infections, and noted some improvement in congestion but not complete resolution. She describes the congestion as 'pressury' and more than her usual baseline due to allergies.  No fever or chills are present. She is always somewhat congested due to her allergies, but the current congestion is slightly more than normal. 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  Review of Systems  Constitutional:  Positive for activity change. Negative for appetite change, chills, fatigue, fever and unexpected weight change.  HENT:  Positive for congestion, postnasal drip, sinus pressure and sinus pain. Negative for ear discharge, ear pain, rhinorrhea, sneezing, sore throat, tinnitus, trouble swallowing and voice change.   Eyes: Negative.   Respiratory:  Positive for cough and shortness of breath. Negative for chest tightness and wheezing.   Cardiovascular: Negative.   Gastrointestinal: Negative.   Neurological: Negative.     Objective:   Physical Exam Constitutional:      Appearance: She is well-developed.  HENT:     Head: Normocephalic and atraumatic.     Comments: Oropharynx with redness and clear drainage, nose with swollen turbinates, TMs normal bilaterally.  Neck:     Thyroid : No thyromegaly.  Cardiovascular:     Rate and Rhythm: Normal rate and regular rhythm.  Pulmonary:     Effort: Pulmonary effort is normal. No respiratory distress.     Breath sounds: Rhonchi present. No wheezing or rales.  Abdominal:     Palpations: Abdomen is soft.  Musculoskeletal:     Cervical back: Normal range of motion.  Lymphadenopathy:     Cervical: No cervical adenopathy.  Skin:    General: Skin is warm and dry.  Neurological:     Mental Status: She is alert and oriented to person, place, and time.     Vitals:   03/17/24 1046  BP: 136/80  Pulse: 81  Temp: 97.7 F (36.5 C)  TempSrc: Oral  SpO2: 99%  Weight: 223 lb (101.2 kg)  Height: 5' 3 (1.6 m)    Assessment and Plan Assessment & Plan Acute bacterial bronchitis  Symptoms ongoing 1.5-2 weeks. Took 1 day old augmentin  which helped but recurrence after stopping. She has concurrent asthma and getting short of breath on exertion. Rx augmentin  5 days and promethazine /dm cough syrup. No wheezing on exam but if lack of improvement or worsening would add steroids. Okay to send in without repeat visit.    "

## 2024-03-17 NOTE — Patient Instructions (Signed)
 I have sent in augmentin  to take for 5 days.  I have sent in the cough medicine to use at night time.
